# Patient Record
Sex: Female | Born: 1935 | Race: White | Hispanic: No | Smoking: Never smoker
Health system: Southern US, Community
[De-identification: ages and names within clinical notes are randomized; demographics above are authoritative.]

## PROBLEM LIST (undated history)

## (undated) DIAGNOSIS — T4145XA Adverse effect of unspecified anesthetic, initial encounter: Secondary | ICD-10-CM

## (undated) DIAGNOSIS — I4430 Unspecified atrioventricular block: Secondary | ICD-10-CM

## (undated) DIAGNOSIS — T8859XA Other complications of anesthesia, initial encounter: Secondary | ICD-10-CM

## (undated) DIAGNOSIS — E119 Type 2 diabetes mellitus without complications: Secondary | ICD-10-CM

## (undated) DIAGNOSIS — R109 Unspecified abdominal pain: Secondary | ICD-10-CM

## (undated) DIAGNOSIS — I639 Cerebral infarction, unspecified: Secondary | ICD-10-CM

## (undated) DIAGNOSIS — H269 Unspecified cataract: Secondary | ICD-10-CM

## (undated) HISTORY — PX: JOINT REPLACEMENT: SHX530

## (undated) HISTORY — DX: Type 2 diabetes mellitus without complications: E11.9

## (undated) HISTORY — PX: CATARACT EXTRACTION, BILATERAL: SHX1313

## (undated) HISTORY — PX: ABDOMINAL HYSTERECTOMY: SHX81

## (undated) HISTORY — DX: Cerebral infarction, unspecified: I63.9

## (undated) HISTORY — DX: Unspecified cataract: H26.9

---

## 2009-10-23 ENCOUNTER — Inpatient Hospital Stay (HOSPITAL_COMMUNITY): Admission: RE | Admit: 2009-10-23 | Discharge: 2009-10-29 | Payer: Self-pay | Admitting: Orthopaedic Surgery

## 2009-10-25 ENCOUNTER — Encounter (INDEPENDENT_AMBULATORY_CARE_PROVIDER_SITE_OTHER): Payer: Self-pay | Admitting: Neurology

## 2009-10-26 ENCOUNTER — Ambulatory Visit: Payer: Self-pay | Admitting: Vascular Surgery

## 2009-10-26 ENCOUNTER — Encounter (INDEPENDENT_AMBULATORY_CARE_PROVIDER_SITE_OTHER): Payer: Self-pay | Admitting: Neurology

## 2010-08-19 LAB — PROTIME-INR
INR: 1.73 — ABNORMAL HIGH (ref 0.00–1.49)
INR: 1.79 — ABNORMAL HIGH (ref 0.00–1.49)
INR: 1.94 — ABNORMAL HIGH (ref 0.00–1.49)
Prothrombin Time: 20.6 seconds — ABNORMAL HIGH (ref 11.6–15.2)

## 2010-08-19 LAB — BASIC METABOLIC PANEL
BUN: 12 mg/dL (ref 6–23)
BUN: 19 mg/dL (ref 6–23)
CO2: 29 mEq/L (ref 19–32)
CO2: 30 mEq/L (ref 19–32)
CO2: 30 mEq/L (ref 19–32)
Calcium: 8.6 mg/dL (ref 8.4–10.5)
Calcium: 8.8 mg/dL (ref 8.4–10.5)
Calcium: 8.9 mg/dL (ref 8.4–10.5)
Calcium: 9.4 mg/dL (ref 8.4–10.5)
Chloride: 97 mEq/L (ref 96–112)
Creatinine, Ser: 0.55 mg/dL (ref 0.4–1.2)
Creatinine, Ser: 0.61 mg/dL (ref 0.4–1.2)
Creatinine, Ser: 0.63 mg/dL (ref 0.4–1.2)
Creatinine, Ser: 0.89 mg/dL (ref 0.4–1.2)
GFR calc Af Amer: >60 mL/min (ref >60)
GFR calc Af Amer: >60 mL/min (ref >60)
GFR calc Af Amer: >60 mL/min (ref >60)
GFR calc non Af Amer: >60 mL/min (ref >60)
GFR calc non Af Amer: >60 mL/min (ref >60)
Glucose, Bld: 102 mg/dL — ABNORMAL HIGH (ref 70–99)
Glucose, Bld: 163 mg/dL — ABNORMAL HIGH (ref 70–99)
Glucose, Bld: 82 mg/dL (ref 70–99)
Potassium: 3.8 mEq/L (ref 3.5–5.1)
Sodium: 139 mEq/L (ref 135–145)
Sodium: 140 mEq/L (ref 135–145)

## 2010-08-19 LAB — CBC
HCT: 30.5 % — ABNORMAL LOW (ref 36.0–46.0)
HCT: 42.3 % (ref 36.0–46.0)
Hemoglobin: 10.6 g/dL — ABNORMAL LOW (ref 12.0–15.0)
Hemoglobin: 12.2 g/dL (ref 12.0–15.0)
Hemoglobin: 14.5 g/dL (ref 12.0–15.0)
MCHC: 34.4 g/dL (ref 30.0–36.0)
MCHC: 34.7 g/dL (ref 30.0–36.0)
MCHC: 35.3 g/dL (ref 30.0–36.0)
Platelets: 179 10*3/uL (ref 150–400)
Platelets: 187 10*3/uL (ref 150–400)
RBC: 3.61 MIL/uL — ABNORMAL LOW (ref 3.87–5.11)
RDW: 13.4 % (ref 11.5–15.5)
RDW: 13.5 % (ref 11.5–15.5)
WBC: 10.2 10*3/uL (ref 4.0–10.5)
WBC: 9 10*3/uL (ref 4.0–10.5)

## 2010-08-19 LAB — GLUCOSE, CAPILLARY
Glucose-Capillary: 121 mg/dL — ABNORMAL HIGH (ref 70–99)
Glucose-Capillary: 123 mg/dL — ABNORMAL HIGH (ref 70–99)
Glucose-Capillary: 139 mg/dL — ABNORMAL HIGH (ref 70–99)
Glucose-Capillary: 145 mg/dL — ABNORMAL HIGH (ref 70–99)
Glucose-Capillary: 152 mg/dL — ABNORMAL HIGH (ref 70–99)
Glucose-Capillary: 154 mg/dL — ABNORMAL HIGH (ref 70–99)
Glucose-Capillary: 156 mg/dL — ABNORMAL HIGH (ref 70–99)
Glucose-Capillary: 157 mg/dL — ABNORMAL HIGH (ref 70–99)
Glucose-Capillary: 165 mg/dL — ABNORMAL HIGH (ref 70–99)
Glucose-Capillary: 176 mg/dL — ABNORMAL HIGH (ref 70–99)
Glucose-Capillary: 184 mg/dL — ABNORMAL HIGH (ref 70–99)
Glucose-Capillary: 200 mg/dL — ABNORMAL HIGH (ref 70–99)
Glucose-Capillary: 95 mg/dL (ref 70–99)

## 2010-08-19 LAB — COMPREHENSIVE METABOLIC PANEL
Alkaline Phosphatase: 40 U/L (ref 39–117)
BUN: 11 mg/dL (ref 6–23)
Chloride: 100 mEq/L (ref 96–112)
GFR calc non Af Amer: >60 mL/min (ref >60)
Glucose, Bld: 122 mg/dL — ABNORMAL HIGH (ref 70–99)
Potassium: 3.3 mEq/L — ABNORMAL LOW (ref 3.5–5.1)
Total Bilirubin: 0.7 mg/dL (ref 0.3–1.2)

## 2010-08-19 LAB — TSH: TSH: 0.86 u[IU]/mL (ref 0.350–4.500)

## 2010-08-19 LAB — HEMOGLOBIN A1C
Hgb A1c MFr Bld: 6.1 % — ABNORMAL HIGH (ref <5.7)
Hgb A1c MFr Bld: 6.1 % — ABNORMAL HIGH (ref <5.7)
Mean Plasma Glucose: 128 mg/dL — ABNORMAL HIGH (ref <117)

## 2011-03-10 ENCOUNTER — Encounter (INDEPENDENT_AMBULATORY_CARE_PROVIDER_SITE_OTHER): Payer: Medicare Other | Admitting: Ophthalmology

## 2011-03-10 DIAGNOSIS — E11319 Type 2 diabetes mellitus with unspecified diabetic retinopathy without macular edema: Secondary | ICD-10-CM

## 2011-03-10 DIAGNOSIS — H43819 Vitreous degeneration, unspecified eye: Secondary | ICD-10-CM

## 2011-03-10 DIAGNOSIS — H3530 Unspecified macular degeneration: Secondary | ICD-10-CM

## 2011-03-10 DIAGNOSIS — H35059 Retinal neovascularization, unspecified, unspecified eye: Secondary | ICD-10-CM

## 2011-03-24 ENCOUNTER — Encounter (INDEPENDENT_AMBULATORY_CARE_PROVIDER_SITE_OTHER): Payer: No Typology Code available for payment source | Admitting: Ophthalmology

## 2011-04-01 ENCOUNTER — Encounter (INDEPENDENT_AMBULATORY_CARE_PROVIDER_SITE_OTHER): Payer: Medicare Other | Admitting: Ophthalmology

## 2011-04-01 DIAGNOSIS — H35059 Retinal neovascularization, unspecified, unspecified eye: Secondary | ICD-10-CM

## 2011-04-01 DIAGNOSIS — H43819 Vitreous degeneration, unspecified eye: Secondary | ICD-10-CM

## 2011-04-01 DIAGNOSIS — H3530 Unspecified macular degeneration: Secondary | ICD-10-CM

## 2011-04-29 ENCOUNTER — Encounter (INDEPENDENT_AMBULATORY_CARE_PROVIDER_SITE_OTHER): Payer: Medicare Other | Admitting: Ophthalmology

## 2011-04-29 DIAGNOSIS — H3530 Unspecified macular degeneration: Secondary | ICD-10-CM

## 2011-08-01 ENCOUNTER — Encounter (INDEPENDENT_AMBULATORY_CARE_PROVIDER_SITE_OTHER): Payer: Medicare Other | Admitting: Ophthalmology

## 2011-08-01 DIAGNOSIS — H35039 Hypertensive retinopathy, unspecified eye: Secondary | ICD-10-CM

## 2011-08-01 DIAGNOSIS — I10 Essential (primary) hypertension: Secondary | ICD-10-CM

## 2011-08-01 DIAGNOSIS — E11319 Type 2 diabetes mellitus with unspecified diabetic retinopathy without macular edema: Secondary | ICD-10-CM

## 2011-08-01 DIAGNOSIS — H43819 Vitreous degeneration, unspecified eye: Secondary | ICD-10-CM

## 2011-08-01 DIAGNOSIS — H3530 Unspecified macular degeneration: Secondary | ICD-10-CM

## 2011-08-01 DIAGNOSIS — E1165 Type 2 diabetes mellitus with hyperglycemia: Secondary | ICD-10-CM

## 2012-02-03 ENCOUNTER — Ambulatory Visit (INDEPENDENT_AMBULATORY_CARE_PROVIDER_SITE_OTHER): Payer: Medicare Other | Admitting: Ophthalmology

## 2012-02-03 DIAGNOSIS — I10 Essential (primary) hypertension: Secondary | ICD-10-CM

## 2012-02-03 DIAGNOSIS — E1139 Type 2 diabetes mellitus with other diabetic ophthalmic complication: Secondary | ICD-10-CM

## 2012-02-03 DIAGNOSIS — E11319 Type 2 diabetes mellitus with unspecified diabetic retinopathy without macular edema: Secondary | ICD-10-CM

## 2012-02-03 DIAGNOSIS — H3530 Unspecified macular degeneration: Secondary | ICD-10-CM

## 2012-02-03 DIAGNOSIS — H3581 Retinal edema: Secondary | ICD-10-CM

## 2012-02-03 DIAGNOSIS — H43819 Vitreous degeneration, unspecified eye: Secondary | ICD-10-CM

## 2012-02-18 ENCOUNTER — Ambulatory Visit (INDEPENDENT_AMBULATORY_CARE_PROVIDER_SITE_OTHER): Payer: Medicare Other | Admitting: Ophthalmology

## 2012-02-18 DIAGNOSIS — E1139 Type 2 diabetes mellitus with other diabetic ophthalmic complication: Secondary | ICD-10-CM

## 2012-02-18 DIAGNOSIS — H3530 Unspecified macular degeneration: Secondary | ICD-10-CM

## 2012-02-18 DIAGNOSIS — E11319 Type 2 diabetes mellitus with unspecified diabetic retinopathy without macular edema: Secondary | ICD-10-CM

## 2012-02-18 DIAGNOSIS — H35039 Hypertensive retinopathy, unspecified eye: Secondary | ICD-10-CM

## 2012-02-18 DIAGNOSIS — H43819 Vitreous degeneration, unspecified eye: Secondary | ICD-10-CM

## 2012-02-18 DIAGNOSIS — H353290 Exudative age-related macular degeneration, unspecified eye, stage unspecified: Secondary | ICD-10-CM

## 2012-02-18 DIAGNOSIS — E1165 Type 2 diabetes mellitus with hyperglycemia: Secondary | ICD-10-CM

## 2012-02-18 DIAGNOSIS — I10 Essential (primary) hypertension: Secondary | ICD-10-CM

## 2012-02-24 ENCOUNTER — Ambulatory Visit (INDEPENDENT_AMBULATORY_CARE_PROVIDER_SITE_OTHER): Payer: Medicare Other | Admitting: Ophthalmology

## 2012-02-24 DIAGNOSIS — H353290 Exudative age-related macular degeneration, unspecified eye, stage unspecified: Secondary | ICD-10-CM

## 2012-02-24 DIAGNOSIS — I10 Essential (primary) hypertension: Secondary | ICD-10-CM

## 2012-02-24 DIAGNOSIS — H3530 Unspecified macular degeneration: Secondary | ICD-10-CM

## 2012-02-24 DIAGNOSIS — H43819 Vitreous degeneration, unspecified eye: Secondary | ICD-10-CM

## 2012-02-24 DIAGNOSIS — E11319 Type 2 diabetes mellitus with unspecified diabetic retinopathy without macular edema: Secondary | ICD-10-CM

## 2012-02-24 DIAGNOSIS — E1165 Type 2 diabetes mellitus with hyperglycemia: Secondary | ICD-10-CM

## 2012-02-24 DIAGNOSIS — H35039 Hypertensive retinopathy, unspecified eye: Secondary | ICD-10-CM

## 2012-03-23 ENCOUNTER — Encounter (INDEPENDENT_AMBULATORY_CARE_PROVIDER_SITE_OTHER): Payer: Medicare Other | Admitting: Ophthalmology

## 2012-03-23 DIAGNOSIS — E11319 Type 2 diabetes mellitus with unspecified diabetic retinopathy without macular edema: Secondary | ICD-10-CM

## 2012-03-23 DIAGNOSIS — H3530 Unspecified macular degeneration: Secondary | ICD-10-CM

## 2012-03-23 DIAGNOSIS — H35039 Hypertensive retinopathy, unspecified eye: Secondary | ICD-10-CM

## 2012-03-23 DIAGNOSIS — H43819 Vitreous degeneration, unspecified eye: Secondary | ICD-10-CM

## 2012-03-23 DIAGNOSIS — I10 Essential (primary) hypertension: Secondary | ICD-10-CM

## 2012-03-23 DIAGNOSIS — H353290 Exudative age-related macular degeneration, unspecified eye, stage unspecified: Secondary | ICD-10-CM

## 2012-03-23 DIAGNOSIS — E1139 Type 2 diabetes mellitus with other diabetic ophthalmic complication: Secondary | ICD-10-CM

## 2012-05-04 ENCOUNTER — Encounter (INDEPENDENT_AMBULATORY_CARE_PROVIDER_SITE_OTHER): Payer: Medicare Other | Admitting: Ophthalmology

## 2012-05-04 DIAGNOSIS — I10 Essential (primary) hypertension: Secondary | ICD-10-CM

## 2012-05-04 DIAGNOSIS — E11319 Type 2 diabetes mellitus with unspecified diabetic retinopathy without macular edema: Secondary | ICD-10-CM

## 2012-05-04 DIAGNOSIS — H43819 Vitreous degeneration, unspecified eye: Secondary | ICD-10-CM

## 2012-05-04 DIAGNOSIS — H353290 Exudative age-related macular degeneration, unspecified eye, stage unspecified: Secondary | ICD-10-CM

## 2012-05-04 DIAGNOSIS — E1139 Type 2 diabetes mellitus with other diabetic ophthalmic complication: Secondary | ICD-10-CM

## 2012-05-04 DIAGNOSIS — H3530 Unspecified macular degeneration: Secondary | ICD-10-CM

## 2012-05-04 DIAGNOSIS — H35039 Hypertensive retinopathy, unspecified eye: Secondary | ICD-10-CM

## 2012-06-29 ENCOUNTER — Encounter (INDEPENDENT_AMBULATORY_CARE_PROVIDER_SITE_OTHER): Payer: Medicare Other | Admitting: Ophthalmology

## 2012-06-29 DIAGNOSIS — H35039 Hypertensive retinopathy, unspecified eye: Secondary | ICD-10-CM

## 2012-06-29 DIAGNOSIS — H353290 Exudative age-related macular degeneration, unspecified eye, stage unspecified: Secondary | ICD-10-CM

## 2012-06-29 DIAGNOSIS — H43819 Vitreous degeneration, unspecified eye: Secondary | ICD-10-CM

## 2012-06-29 DIAGNOSIS — I10 Essential (primary) hypertension: Secondary | ICD-10-CM

## 2012-07-27 ENCOUNTER — Encounter (INDEPENDENT_AMBULATORY_CARE_PROVIDER_SITE_OTHER): Payer: Medicare Other | Admitting: Ophthalmology

## 2012-07-27 DIAGNOSIS — H35039 Hypertensive retinopathy, unspecified eye: Secondary | ICD-10-CM

## 2012-07-27 DIAGNOSIS — H353290 Exudative age-related macular degeneration, unspecified eye, stage unspecified: Secondary | ICD-10-CM

## 2012-07-27 DIAGNOSIS — H43819 Vitreous degeneration, unspecified eye: Secondary | ICD-10-CM

## 2012-07-27 DIAGNOSIS — I10 Essential (primary) hypertension: Secondary | ICD-10-CM

## 2012-09-07 ENCOUNTER — Encounter (INDEPENDENT_AMBULATORY_CARE_PROVIDER_SITE_OTHER): Payer: Medicare Other | Admitting: Ophthalmology

## 2012-09-07 DIAGNOSIS — H353290 Exudative age-related macular degeneration, unspecified eye, stage unspecified: Secondary | ICD-10-CM

## 2012-09-07 DIAGNOSIS — H43819 Vitreous degeneration, unspecified eye: Secondary | ICD-10-CM

## 2012-09-07 DIAGNOSIS — H35039 Hypertensive retinopathy, unspecified eye: Secondary | ICD-10-CM

## 2012-09-07 DIAGNOSIS — I10 Essential (primary) hypertension: Secondary | ICD-10-CM

## 2012-10-07 ENCOUNTER — Encounter (INDEPENDENT_AMBULATORY_CARE_PROVIDER_SITE_OTHER): Payer: Medicare Other | Admitting: Ophthalmology

## 2012-10-07 DIAGNOSIS — H43819 Vitreous degeneration, unspecified eye: Secondary | ICD-10-CM

## 2012-10-07 DIAGNOSIS — I10 Essential (primary) hypertension: Secondary | ICD-10-CM

## 2012-10-07 DIAGNOSIS — H353290 Exudative age-related macular degeneration, unspecified eye, stage unspecified: Secondary | ICD-10-CM

## 2012-10-07 DIAGNOSIS — H35039 Hypertensive retinopathy, unspecified eye: Secondary | ICD-10-CM

## 2012-11-18 ENCOUNTER — Encounter (INDEPENDENT_AMBULATORY_CARE_PROVIDER_SITE_OTHER): Payer: Medicare Other | Admitting: Ophthalmology

## 2012-11-18 DIAGNOSIS — H3530 Unspecified macular degeneration: Secondary | ICD-10-CM

## 2012-11-18 DIAGNOSIS — H353290 Exudative age-related macular degeneration, unspecified eye, stage unspecified: Secondary | ICD-10-CM

## 2012-11-18 DIAGNOSIS — H35039 Hypertensive retinopathy, unspecified eye: Secondary | ICD-10-CM

## 2012-11-18 DIAGNOSIS — H43819 Vitreous degeneration, unspecified eye: Secondary | ICD-10-CM

## 2012-11-18 DIAGNOSIS — I10 Essential (primary) hypertension: Secondary | ICD-10-CM

## 2012-12-23 ENCOUNTER — Encounter (INDEPENDENT_AMBULATORY_CARE_PROVIDER_SITE_OTHER): Payer: Medicare Other | Admitting: Ophthalmology

## 2012-12-23 DIAGNOSIS — H3530 Unspecified macular degeneration: Secondary | ICD-10-CM

## 2012-12-23 DIAGNOSIS — H43819 Vitreous degeneration, unspecified eye: Secondary | ICD-10-CM

## 2012-12-23 DIAGNOSIS — H353290 Exudative age-related macular degeneration, unspecified eye, stage unspecified: Secondary | ICD-10-CM

## 2012-12-23 DIAGNOSIS — H35039 Hypertensive retinopathy, unspecified eye: Secondary | ICD-10-CM

## 2012-12-23 DIAGNOSIS — I10 Essential (primary) hypertension: Secondary | ICD-10-CM

## 2013-02-03 ENCOUNTER — Encounter (INDEPENDENT_AMBULATORY_CARE_PROVIDER_SITE_OTHER): Payer: Medicare Other | Admitting: Ophthalmology

## 2013-02-03 DIAGNOSIS — H43819 Vitreous degeneration, unspecified eye: Secondary | ICD-10-CM

## 2013-02-03 DIAGNOSIS — H353290 Exudative age-related macular degeneration, unspecified eye, stage unspecified: Secondary | ICD-10-CM

## 2013-02-03 DIAGNOSIS — H35039 Hypertensive retinopathy, unspecified eye: Secondary | ICD-10-CM

## 2013-02-03 DIAGNOSIS — I10 Essential (primary) hypertension: Secondary | ICD-10-CM

## 2013-03-10 ENCOUNTER — Encounter (INDEPENDENT_AMBULATORY_CARE_PROVIDER_SITE_OTHER): Payer: Medicare Other | Admitting: Ophthalmology

## 2013-03-10 DIAGNOSIS — I10 Essential (primary) hypertension: Secondary | ICD-10-CM

## 2013-03-10 DIAGNOSIS — H43819 Vitreous degeneration, unspecified eye: Secondary | ICD-10-CM

## 2013-03-10 DIAGNOSIS — H35039 Hypertensive retinopathy, unspecified eye: Secondary | ICD-10-CM

## 2013-03-10 DIAGNOSIS — H353290 Exudative age-related macular degeneration, unspecified eye, stage unspecified: Secondary | ICD-10-CM

## 2013-03-29 ENCOUNTER — Ambulatory Visit (INDEPENDENT_AMBULATORY_CARE_PROVIDER_SITE_OTHER): Payer: Medicare Other | Admitting: Family Medicine

## 2013-03-29 ENCOUNTER — Ambulatory Visit: Payer: Medicare Other

## 2013-03-29 VITALS — BP 130/62 | HR 85 | Temp 98.1°F | Resp 18 | Wt 172.0 lb

## 2013-03-29 DIAGNOSIS — S8990XA Unspecified injury of unspecified lower leg, initial encounter: Secondary | ICD-10-CM

## 2013-03-29 DIAGNOSIS — S8992XA Unspecified injury of left lower leg, initial encounter: Secondary | ICD-10-CM

## 2013-03-29 DIAGNOSIS — M25569 Pain in unspecified knee: Secondary | ICD-10-CM

## 2013-03-29 DIAGNOSIS — M7989 Other specified soft tissue disorders: Secondary | ICD-10-CM

## 2013-03-29 DIAGNOSIS — M25562 Pain in left knee: Secondary | ICD-10-CM

## 2013-03-29 MED ORDER — TRAMADOL HCL 50 MG PO TABS: 50.0000 mg | ORAL_TABLET | Freq: Four times a day (QID) | ORAL | Status: DC | PRN

## 2013-03-29 NOTE — Progress Notes (Signed)
Subjective:    Patient ID: Briana Martinez, female    DOB: 20-Oct-1921, 79 y.o.   MRN: [id number] This chart was scribed for Meredith Staggers, MD by Danella Maiers, ED Scribe. This patient was seen in room 11 and the patient's care was started at 3:18 PM.  Chief Complaint  Patient presents with  . Knee Injury    left- fell a week ago having some swelling in ankle and leg with fever    HPI HPI Comments: Briana Martinez is a 79 y.o. female with a h/o left knee replacement who presents to the [facility] complaining of falling off a stool 8 days ago with resulting worsening left leg pain. Here with her daughter. Daughter states she took pt to her orthopaedic surgeon, Dr Jerl Santos, who x-rayed her knee and stated her knee was fine but she might be sore for a few days. Two days ago she reports significant swelling and warmth to touch. Bruising started today. She states the swelling has gone down some today. She reports associated hip pain since a few days ago that hurts when she moves. Having a difficulty history on the hip pain, pt is unsure whether she is sore or not.later states no pain in hip.  Pt reports pain from the knee down when she puts weight on the left leg, then later in visit - denied pain in hip, more pain in outside of L leg.  She has been icing the area and taking hydrocodone and tylenol. She denies CP, SOB. She denies h/o blood clots. She is on aspirin 325 mg per day.    Review of Systems  Respiratory: Negative for shortness of breath.   Cardiovascular: Negative for chest pain.  Musculoskeletal: Positive for arthralgias.    There are no active problems to display for this patient.  Past Medical History  Diagnosis Date  . Cataract   . Diabetes mellitus without complication    Past Surgical History  Procedure Laterality Date  . Joint replacement    . Abdominal hysterectomy     No Known Allergies Prior to Admission medications   Medication Sig Start Date End  Date Taking? Authorizing Provider  amLODipine (NORVASC) 5 MG tablet Take 5 mg by mouth daily.   Yes Historical Provider, MD  aspirin 81 MG tablet Take 81 mg by mouth daily.   Yes Historical Provider, MD  glipiZIDE (GLUCOTROL) 5 MG tablet Take 5 mg by mouth 3 (three) times daily.   Yes Historical Provider, MD  Multiple Vitamin (MULTI-VITAMIN PO) Take by mouth.   Yes Historical Provider, MD  Multiple Vitamins-Minerals (PRESERVISION AREDS PO) Take by mouth 2 (two) times daily.   Yes Historical Provider, MD  pravastatin (PRAVACHOL) 20 MG tablet Take 20 mg by mouth daily.   Yes Historical Provider, MD        Objective:   Physical Exam  Nursing note and vitals reviewed. Constitutional: She is oriented to person, place, and time. She appears well-developed and well-nourished. No distress.  HENT:  Head: Normocephalic and atraumatic.  Cardiovascular: Normal rate, regular rhythm, normal heart sounds and intact distal pulses.   Pulmonary/Chest: Effort normal and breath sounds normal. No respiratory distress. She has no wheezes. She has no rales.  Musculoskeletal: Normal range of motion.       Left lower leg: She exhibits tenderness and swelling.  Bruising to lateral and dorsum of left foot, ecchymosis. No focal bony tenderness of foot or ankle. neurovascularly intact distally to toes. Slight  hyper pigmentations, faded ecchymosis from posterior knee throughout calf to foot. Full extension of the left knee, flexion to about 140-150 degrees. ttp in popliteal fossa to upper calf, slight ttp of the distal hamstring. With int and ext rotations of the hip, little bit of slight discomfort but no pain. Negative homans sign.  Neurological: She is alert and oriented to person, place, and time.  Skin: Skin is warm and dry.  Psychiatric: She has a normal mood and affect. Her behavior is normal.  warmth and edema of LE from knee to ankle, without rash or notable erythema, diffuse ttp around knee - popliteal fossa to  lateral knee.   Filed Vitals:   03/29/13 1419  BP: 130/62  Pulse: 85  Temp: 98.1 F (36.7 C)  TempSrc: Oral  Resp: 18  Weight: 172 lb (78.019 kg)  SpO2: 97%    [facility] reading (PRIMARY) by  Dr. Neva Seat: L hip: degenerative dz without apparent fx.  L knee: s/p knee replacement - appears located, some surrounding degenerative change  * xray report reviewed - stat report: IMPRESSION:  Findings suspicious for a left distal femur lateral condylar  fracture. This is a subtle finding. Consider CT for confirmation.  Prior left knee arthroplasty  Osteopenia  Small effusion  L Tib fib: no apparent fx.   1750: call placed to [facility] re: above, D.r Dumonski.       Assessment & Plan:  Briana Martinez is a 79 y.o. female Briana Martinez is a 79 y.o. female Leg injury, left, initial encounter - Plan: DG Knee Complete 4 Views Left, DG Tibia/Fibula Left, DG Ankle Complete Left, DG Hip Complete Left, CT Knee Left Wo Contrast, traMADol (ULTRAM) 50 MG tablet  Knee pain, acute, left - Plan: CT Knee Left Wo Contrast, traMADol (ULTRAM) 50 MG tablet  Left leg swelling - Plan: CT Knee Left Wo Contrast, traMADol (ULTRAM) 50 MG tablet  L knee to lower leg progressive swelling and ecchymosis after fall 8 days ago.  Possible lateral condylar fx. Discussed with orthopaedist on call for Dr. Jerl Santos.  Placed in knee immobilizer with min WB as possible with walker/cane and follow up with ortho in next 2 days, but will try to obtain CT of knee prior for further eval of subtle fracture area. Ultram if needed for pain - SED, and RTC precautions given to pt and dtr. They will call orth tomorrow am to be seen.    Meds ordered this encounter  Medications  . aspirin 81 MG tablet    Sig: Take 81 mg by mouth daily.  . pravastatin (PRAVACHOL) 20 MG tablet    Sig: Take 20 mg by mouth daily.  Marland Kitchen glipiZIDE (GLUCOTROL) 5 MG tablet    Sig: Take 5 mg by mouth 3 (three) times daily.  Marland Kitchen amLODipine (NORVASC) 5 MG  tablet    Sig: Take 5 mg by mouth daily.  . Multiple Vitamin (MULTI-VITAMIN PO)    Sig: Take by mouth.  . Multiple Vitamins-Minerals (PRESERVISION AREDS PO)    Sig: Take by mouth 2 (two) times daily.  . traMADol (ULTRAM) 50 MG tablet    Sig: Take 1 tablet (50 mg total) by mouth every 6 (six) hours as needed for pain.    Dispense:  15 tablet    Refill:  0   Patient Instructions  Wear the knee immobilizer until seen by your orthopaedic doctor. Try to stay off the leg until seen, but slight weight bear with cane if needed.  We will schedule a ct scan of your knee for better evaluation of a possible fracture, but call Dr. Nolon Nations office tomorrow to be seen in the next 2 days. Return to the clinic or go to the nearest emergency room if any of your symptoms worsen or new symptoms occur.

## 2013-03-29 NOTE — Patient Instructions (Signed)
Wear the knee immobilizer until seen by your orthopaedic doctor. Try to stay off the leg until seen, but slight weight bear with cane if needed. We will schedule a ct scan of your knee for better evaluation of a possible fracture, but call Dr. Nolon Nations office tomorrow to be seen in the next 2 days. Return to the clinic or go to the nearest emergency room if any of your symptoms worsen or new symptoms occur.

## 2013-03-30 ENCOUNTER — Telehealth: Payer: Self-pay

## 2013-03-30 ENCOUNTER — Telehealth: Payer: Self-pay | Admitting: Family Medicine

## 2013-03-30 ENCOUNTER — Ambulatory Visit
Admission: RE | Admit: 2013-03-30 | Discharge: 2013-03-30 | Disposition: A | Discharge status: Home / Self Care | Payer: Medicare Other | Source: Ambulatory Visit | Attending: Family Medicine | Admitting: Family Medicine

## 2013-03-30 DIAGNOSIS — S8992XA Unspecified injury of left lower leg, initial encounter: Secondary | ICD-10-CM

## 2013-03-30 DIAGNOSIS — M25562 Pain in left knee: Secondary | ICD-10-CM

## 2013-03-30 DIAGNOSIS — S8010XS Contusion of unspecified lower leg, sequela: Secondary | ICD-10-CM

## 2013-03-30 DIAGNOSIS — M7989 Other specified soft tissue disorders: Secondary | ICD-10-CM

## 2013-03-30 NOTE — Telephone Encounter (Signed)
Thank you. I have put in referral. Called Dr Jerl Santos office. Her daughter Fannie Knee called there and Dr Jerl Santos is aware of the situation with the patient, and wants to review the CT Scan then advise. I will call Dr Jerl Santos this afternoon and make sure they get the information.

## 2013-03-30 NOTE — Telephone Encounter (Signed)
Call report: Findings suspicious for a left distal femur lateral condylar fracture. This is a subtle finding. Consider CT for confirmation

## 2013-03-30 NOTE — Telephone Encounter (Signed)
Patient's daughter is calling about an appointment to an orthopaedic surgeon for her mother.  [phone]

## 2013-03-30 NOTE — Telephone Encounter (Signed)
Noted.  See ov notes from last night - she is supposed to be seen by ortho and does not need to wait on results of CT to schedule. the patient was supposed to call Dr. Nolon Nations office today to get appointment as I spoke to the on call doctor last night that indicated they would be able to work her in in the next few days if needed. Hopefully the CT can be done today, so they will have results to review at ortho appt.

## 2013-03-30 NOTE — Telephone Encounter (Signed)
Has she had the CT yet? She should go today for a walkin scan at [facility], [address] [place], daughter Fannie Knee advised. She will take her. Lupita Leash will advise [facility] imaging patient will need help getting out of the car. Once we have the results of the CT, will work on ortho (if indicated) to you [employer]

## 2013-03-30 NOTE — Telephone Encounter (Signed)
This was noted last night - see ov.

## 2013-04-21 ENCOUNTER — Encounter (INDEPENDENT_AMBULATORY_CARE_PROVIDER_SITE_OTHER): Payer: Medicare Other | Admitting: Ophthalmology

## 2013-04-21 DIAGNOSIS — I10 Essential (primary) hypertension: Secondary | ICD-10-CM

## 2013-04-21 DIAGNOSIS — H43819 Vitreous degeneration, unspecified eye: Secondary | ICD-10-CM

## 2013-04-21 DIAGNOSIS — H35039 Hypertensive retinopathy, unspecified eye: Secondary | ICD-10-CM

## 2013-04-21 DIAGNOSIS — H353290 Exudative age-related macular degeneration, unspecified eye, stage unspecified: Secondary | ICD-10-CM

## 2013-06-30 ENCOUNTER — Encounter (INDEPENDENT_AMBULATORY_CARE_PROVIDER_SITE_OTHER): Payer: Medicare Other | Admitting: Ophthalmology

## 2013-06-30 DIAGNOSIS — H35039 Hypertensive retinopathy, unspecified eye: Secondary | ICD-10-CM

## 2013-06-30 DIAGNOSIS — H43819 Vitreous degeneration, unspecified eye: Secondary | ICD-10-CM

## 2013-06-30 DIAGNOSIS — I10 Essential (primary) hypertension: Secondary | ICD-10-CM

## 2013-06-30 DIAGNOSIS — H353290 Exudative age-related macular degeneration, unspecified eye, stage unspecified: Secondary | ICD-10-CM

## 2013-08-11 ENCOUNTER — Encounter (INDEPENDENT_AMBULATORY_CARE_PROVIDER_SITE_OTHER): Payer: Medicare Other | Admitting: Ophthalmology

## 2013-08-11 DIAGNOSIS — H43819 Vitreous degeneration, unspecified eye: Secondary | ICD-10-CM

## 2013-08-11 DIAGNOSIS — I10 Essential (primary) hypertension: Secondary | ICD-10-CM

## 2013-08-11 DIAGNOSIS — H35039 Hypertensive retinopathy, unspecified eye: Secondary | ICD-10-CM

## 2013-08-11 DIAGNOSIS — H353290 Exudative age-related macular degeneration, unspecified eye, stage unspecified: Secondary | ICD-10-CM

## 2013-10-06 ENCOUNTER — Encounter (INDEPENDENT_AMBULATORY_CARE_PROVIDER_SITE_OTHER): Payer: Medicare Other | Admitting: Ophthalmology

## 2013-10-06 DIAGNOSIS — H353290 Exudative age-related macular degeneration, unspecified eye, stage unspecified: Secondary | ICD-10-CM

## 2013-10-06 DIAGNOSIS — H43819 Vitreous degeneration, unspecified eye: Secondary | ICD-10-CM

## 2013-10-06 DIAGNOSIS — I10 Essential (primary) hypertension: Secondary | ICD-10-CM

## 2013-10-06 DIAGNOSIS — H35039 Hypertensive retinopathy, unspecified eye: Secondary | ICD-10-CM

## 2013-12-15 ENCOUNTER — Encounter (INDEPENDENT_AMBULATORY_CARE_PROVIDER_SITE_OTHER): Payer: Medicare Other | Admitting: Ophthalmology

## 2013-12-15 DIAGNOSIS — H35039 Hypertensive retinopathy, unspecified eye: Secondary | ICD-10-CM

## 2013-12-15 DIAGNOSIS — H353290 Exudative age-related macular degeneration, unspecified eye, stage unspecified: Secondary | ICD-10-CM

## 2013-12-15 DIAGNOSIS — H43819 Vitreous degeneration, unspecified eye: Secondary | ICD-10-CM

## 2013-12-15 DIAGNOSIS — I10 Essential (primary) hypertension: Secondary | ICD-10-CM

## 2014-03-09 ENCOUNTER — Encounter (INDEPENDENT_AMBULATORY_CARE_PROVIDER_SITE_OTHER): Payer: Medicare Other | Admitting: Ophthalmology

## 2014-03-09 DIAGNOSIS — H3531 Nonexudative age-related macular degeneration: Secondary | ICD-10-CM

## 2014-03-09 DIAGNOSIS — H43813 Vitreous degeneration, bilateral: Secondary | ICD-10-CM

## 2014-03-09 DIAGNOSIS — H3532 Exudative age-related macular degeneration: Secondary | ICD-10-CM

## 2014-03-09 DIAGNOSIS — I10 Essential (primary) hypertension: Secondary | ICD-10-CM

## 2014-03-09 DIAGNOSIS — H35033 Hypertensive retinopathy, bilateral: Secondary | ICD-10-CM

## 2014-06-15 ENCOUNTER — Encounter (INDEPENDENT_AMBULATORY_CARE_PROVIDER_SITE_OTHER): Payer: Medicare Other | Admitting: Ophthalmology

## 2014-06-15 DIAGNOSIS — H3531 Nonexudative age-related macular degeneration: Secondary | ICD-10-CM

## 2014-06-15 DIAGNOSIS — H3532 Exudative age-related macular degeneration: Secondary | ICD-10-CM

## 2014-06-15 DIAGNOSIS — H43813 Vitreous degeneration, bilateral: Secondary | ICD-10-CM

## 2014-06-15 DIAGNOSIS — I10 Essential (primary) hypertension: Secondary | ICD-10-CM

## 2014-06-15 DIAGNOSIS — H35033 Hypertensive retinopathy, bilateral: Secondary | ICD-10-CM

## 2014-09-21 ENCOUNTER — Encounter (INDEPENDENT_AMBULATORY_CARE_PROVIDER_SITE_OTHER): Payer: Medicare Other | Admitting: Ophthalmology

## 2014-09-21 DIAGNOSIS — H3532 Exudative age-related macular degeneration: Secondary | ICD-10-CM | POA: Diagnosis not present

## 2014-09-21 DIAGNOSIS — H3531 Nonexudative age-related macular degeneration: Secondary | ICD-10-CM

## 2014-09-21 DIAGNOSIS — H35033 Hypertensive retinopathy, bilateral: Secondary | ICD-10-CM

## 2014-09-21 DIAGNOSIS — H43813 Vitreous degeneration, bilateral: Secondary | ICD-10-CM | POA: Diagnosis not present

## 2014-09-21 DIAGNOSIS — I10 Essential (primary) hypertension: Secondary | ICD-10-CM | POA: Diagnosis not present

## 2014-12-28 ENCOUNTER — Encounter (INDEPENDENT_AMBULATORY_CARE_PROVIDER_SITE_OTHER): Payer: Medicare Other | Admitting: Ophthalmology

## 2014-12-28 DIAGNOSIS — H3532 Exudative age-related macular degeneration: Secondary | ICD-10-CM

## 2014-12-28 DIAGNOSIS — I10 Essential (primary) hypertension: Secondary | ICD-10-CM | POA: Diagnosis not present

## 2014-12-28 DIAGNOSIS — H43813 Vitreous degeneration, bilateral: Secondary | ICD-10-CM | POA: Diagnosis not present

## 2014-12-28 DIAGNOSIS — H35033 Hypertensive retinopathy, bilateral: Secondary | ICD-10-CM

## 2014-12-28 DIAGNOSIS — E11319 Type 2 diabetes mellitus with unspecified diabetic retinopathy without macular edema: Secondary | ICD-10-CM | POA: Diagnosis not present

## 2014-12-28 DIAGNOSIS — E11329 Type 2 diabetes mellitus with mild nonproliferative diabetic retinopathy without macular edema: Secondary | ICD-10-CM | POA: Diagnosis not present

## 2014-12-28 DIAGNOSIS — H3531 Nonexudative age-related macular degeneration: Secondary | ICD-10-CM

## 2015-04-19 ENCOUNTER — Encounter (INDEPENDENT_AMBULATORY_CARE_PROVIDER_SITE_OTHER): Payer: Medicare Other | Admitting: Ophthalmology

## 2015-04-19 DIAGNOSIS — E11311 Type 2 diabetes mellitus with unspecified diabetic retinopathy with macular edema: Secondary | ICD-10-CM

## 2015-04-19 DIAGNOSIS — H353231 Exudative age-related macular degeneration, bilateral, with active choroidal neovascularization: Secondary | ICD-10-CM | POA: Diagnosis not present

## 2015-04-19 DIAGNOSIS — H43813 Vitreous degeneration, bilateral: Secondary | ICD-10-CM

## 2015-04-19 DIAGNOSIS — I10 Essential (primary) hypertension: Secondary | ICD-10-CM

## 2015-04-19 DIAGNOSIS — H35033 Hypertensive retinopathy, bilateral: Secondary | ICD-10-CM

## 2015-04-19 DIAGNOSIS — E113312 Type 2 diabetes mellitus with moderate nonproliferative diabetic retinopathy with macular edema, left eye: Secondary | ICD-10-CM | POA: Diagnosis not present

## 2015-04-19 DIAGNOSIS — E113211 Type 2 diabetes mellitus with mild nonproliferative diabetic retinopathy with macular edema, right eye: Secondary | ICD-10-CM | POA: Diagnosis not present

## 2015-05-28 ENCOUNTER — Ambulatory Visit (INDEPENDENT_AMBULATORY_CARE_PROVIDER_SITE_OTHER): Payer: Medicare Other

## 2015-05-28 ENCOUNTER — Ambulatory Visit (INDEPENDENT_AMBULATORY_CARE_PROVIDER_SITE_OTHER): Payer: Medicare Other | Admitting: Emergency Medicine

## 2015-05-28 VITALS — BP 108/74 | HR 65 | Temp 98.2°F | Resp 18 | Wt 163.2 lb

## 2015-05-28 DIAGNOSIS — M25562 Pain in left knee: Secondary | ICD-10-CM

## 2015-05-28 DIAGNOSIS — M545 Low back pain, unspecified: Secondary | ICD-10-CM

## 2015-05-28 DIAGNOSIS — S8992XA Unspecified injury of left lower leg, initial encounter: Secondary | ICD-10-CM

## 2015-05-28 DIAGNOSIS — M7989 Other specified soft tissue disorders: Secondary | ICD-10-CM | POA: Diagnosis not present

## 2015-05-28 DIAGNOSIS — Z23 Encounter for immunization: Secondary | ICD-10-CM

## 2015-05-28 MED ORDER — TRAMADOL HCL 50 MG PO TABS: 50.0000 mg | ORAL_TABLET | Freq: Four times a day (QID) | ORAL | Status: DC | PRN

## 2015-05-28 NOTE — Progress Notes (Signed)
Subjective:  Patient ID: Briana Martinez, female    DOB: Oct 18, 1921  Age: 81 y.o. MRN: [id number]  CC: Fall; Rash; and Immunizations   HPI Briana BurGeneva M Schwall presents   Patient fell 10 days ago and landed on her tail and is concerned that she has a cracker fracture of her coccyx. She has some pain with sitting. No difficulty walking.  Or bearing weight.  she has no low back pain. She has no radiation of pain numbness tingling or weakness in his lower extremities. She has no nausea vomiting or difficulty moving her stools or voiding  History Louie CasaGeneva has a past medical history of Cataract; Diabetes mellitus without complication (HCC); and Stroke (HCC).   She has past surgical history that includes Joint replacement and Abdominal hysterectomy.   Her  family history is not on file.  She   reports that she has never smoked. She does not have any smokeless tobacco history on file. Her alcohol and drug histories are not on file.  Outpatient Prescriptions Prior to Visit  Medication Sig Dispense Refill  . amLODipine (NORVASC) 5 MG tablet Take 5 mg by mouth daily.    Marland Kitchen. glipiZIDE (GLUCOTROL) 5 MG tablet Take 5 mg by mouth 3 (three) times daily.    . pravastatin (PRAVACHOL) 20 MG tablet Take 20 mg by mouth daily.    Marland Kitchen. aspirin 81 MG tablet Take 81 mg by mouth daily. Reported on 05/28/2015    . Multiple Vitamin (MULTI-VITAMIN PO) Take by mouth. Reported on 05/28/2015    . Multiple Vitamins-Minerals (PRESERVISION AREDS PO) Take by mouth 2 (two) times daily. Reported on 05/28/2015    . traMADol (ULTRAM) 50 MG tablet Take 1 tablet (50 mg total) by mouth every 6 (six) hours as needed for pain. (Patient not taking: Reported on 05/28/2015) 15 tablet 0   No facility-administered medications prior to visit.    Social History   Social History  . Marital Status: Widowed    Spouse Name: N/A  . Number of Children: N/A  . Years of Education: N/A   Social History Main Topics  . Smoking status: Never  Smoker   . Smokeless tobacco: None  . Alcohol Use: None  . Drug Use: None  . Sexual Activity: Not Asked   Other Topics Concern  . None   Social History Narrative     Review of Systems  Constitutional: Negative for fever, chills and appetite change.  HENT: Negative for congestion, ear pain, postnasal drip, sinus pressure and sore throat.   Eyes: Negative for pain and redness.  Respiratory: Negative for cough, shortness of breath and wheezing.   Cardiovascular: Negative for leg swelling.  Gastrointestinal: Negative for nausea, vomiting, abdominal pain, diarrhea, constipation and blood in stool.  Endocrine: Negative for polyuria.  Genitourinary: Negative for dysuria, urgency, frequency and flank pain.  Musculoskeletal: Negative for gait problem.  Skin: Negative for rash.  Neurological: Negative for weakness and headaches.  Psychiatric/Behavioral: Negative for confusion and decreased concentration. The patient is not nervous/anxious.     Objective:  BP 108/74 mmHg  Pulse 65  Temp(Src) 98.2 F (36.8 C) (Oral)  Resp 18  Wt 163 lb 3.2 oz (74.027 kg)  SpO2 98%  Physical Exam  Constitutional: She is oriented to person, place, and time. She appears well-developed and well-nourished.  HENT:  Head: Normocephalic and atraumatic.  Eyes: Conjunctivae are normal. Pupils are equal, round, and reactive to light.  Pulmonary/Chest: Effort normal.  Musculoskeletal: She exhibits no edema.  Lumbar back: She exhibits tenderness (She's tender over her coccyx).  Neurological: She is alert and oriented to person, place, and time.  Skin: Skin is dry.  Psychiatric: She has a normal mood and affect. Her behavior is normal. Thought content normal.      Assessment & Plan:   [place] was seen today for fall, rash and immunizations.  Diagnoses and all orders for this visit:  Midline low back pain without sciatica -     DG Sacrum/Coccyx; Future  Needs flu shot -     Flu Vaccine QUAD 36+  mos IM  Leg injury, left, initial encounter -     traMADol (ULTRAM) 50 MG tablet; Take 1 tablet (50 mg total) by mouth every 6 (six) hours as needed.  Knee pain, acute, left -     traMADol (ULTRAM) 50 MG tablet; Take 1 tablet (50 mg total) by mouth every 6 (six) hours as needed.  Left leg swelling -     traMADol (ULTRAM) 50 MG tablet; Take 1 tablet (50 mg total) by mouth every 6 (six) hours as needed.  I have changed Ms. Goldblatt's traMADol. I am also having her maintain her aspirin, pravastatin, glipiZIDE, amLODipine, Multiple Vitamin (MULTI-VITAMIN PO), Multiple Vitamins-Minerals (PRESERVISION AREDS PO), and aspirin.  Meds ordered this encounter  Medications  . aspirin 325 MG tablet    Sig: Take 325 mg by mouth daily.  . traMADol (ULTRAM) 50 MG tablet    Sig: Take 1 tablet (50 mg total) by mouth every 6 (six) hours as needed.    Dispense:  15 tablet    Refill:  0    Appropriate red flag conditions were discussed with the patient as well as actions that should be taken.  Patient expressed his understanding.  Follow-up: Return if symptoms worsen or fail to improve.  Carmelina Dane, MD   [facility] reading (PRIMARY) by  Dr. Dareen Piano.  negative.

## 2015-05-28 NOTE — Patient Instructions (Signed)
Tailbone Injury °The tailbone (coccyx) is the small bone at the lower end of the spine. A tailbone injury may involve stretched ligaments, bruising, or a broken bone (fracture). Tailbone injuries can be painful, and some may take a long time to heal. °CAUSES °This condition is often caused by falling and landing on the tailbone. Other causes include: °· Repeated strain or friction from actions such as rowing and bicycling. °· Childbirth. °In some cases, the cause may not be known. °RISK FACTORS °This condition is more common in women than in men. °SYMPTOMS °Symptoms of this condition include: °· Pain in the lower back, especially when sitting. °· Pain or difficulty when standing up from a sitting position. °· Bruising in the tailbone area. °· Painful bowel movements. °· In women, pain during intercourse. °DIAGNOSIS °This condition may be diagnosed based on your symptoms and a physical exam. X-rays may be taken if a fracture is suspected. You may also have other tests, such as a CT scan or MRI. °TREATMENT °This condition may be treated with medicines to help relieve your pain. Most tailbone injuries heal on their own in 4-6 weeks. However, recovery time may be longer if the injury involves a fracture. °HOME CARE INSTRUCTIONS °· Take medicines only as directed by your health care provider. °· If directed, apply ice to the injured area: °¨ Put ice in a plastic bag. °¨ Place a towel between your skin and the bag. °¨ Leave the ice on for 20 minutes, 2-3 times per day for the first 1-2 days. °· Sit on a large, rubber or inflated ring or cushion to ease your pain. Lean forward when you are sitting to help decrease discomfort. °· Avoid sitting for long periods of time. °· Increase your activity as the pain allows. Perform any exercises that are recommended by your health care provider or physical therapist. °· If you have pain during bowel movements, use stool softeners as directed by your health care provider. °· Eat a  diet that includes plenty of fiber to help prevent constipation. °· Keep all follow-up visits as directed by your health care provider. This is important. °PREVENTION °Wear appropriate padding and sports gear when bicycling and rowing. This can help to prevent developing an injury that is caused by repeated strain or friction. °SEEK MEDICAL CARE IF: °· Your pain becomes worse. °· Your bowel movements cause a great deal of discomfort. °· You are unable to have a bowel movement. °· You have uncontrolled urine loss (urinary incontinence). °· You have a fever. °  °This information is not intended to replace advice given to you by your health care provider. Make sure you discuss any questions you have with your health care provider. °  °Document Released: 05/16/2000 Document Revised: 10/03/2014 Document Reviewed: 05/15/2014 °Elsevier Interactive Patient Education ©2016 [employer] ° °

## 2015-08-09 ENCOUNTER — Encounter (INDEPENDENT_AMBULATORY_CARE_PROVIDER_SITE_OTHER): Payer: Medicare Other | Admitting: Ophthalmology

## 2015-08-09 DIAGNOSIS — H353231 Exudative age-related macular degeneration, bilateral, with active choroidal neovascularization: Secondary | ICD-10-CM

## 2015-08-09 DIAGNOSIS — H43813 Vitreous degeneration, bilateral: Secondary | ICD-10-CM

## 2015-08-09 DIAGNOSIS — E113293 Type 2 diabetes mellitus with mild nonproliferative diabetic retinopathy without macular edema, bilateral: Secondary | ICD-10-CM

## 2015-08-09 DIAGNOSIS — I10 Essential (primary) hypertension: Secondary | ICD-10-CM | POA: Diagnosis not present

## 2015-08-09 DIAGNOSIS — H35033 Hypertensive retinopathy, bilateral: Secondary | ICD-10-CM

## 2015-08-09 DIAGNOSIS — E11319 Type 2 diabetes mellitus with unspecified diabetic retinopathy without macular edema: Secondary | ICD-10-CM | POA: Diagnosis not present

## 2016-01-17 ENCOUNTER — Encounter (INDEPENDENT_AMBULATORY_CARE_PROVIDER_SITE_OTHER): Payer: Medicare Other | Admitting: Ophthalmology

## 2016-01-17 DIAGNOSIS — E113313 Type 2 diabetes mellitus with moderate nonproliferative diabetic retinopathy with macular edema, bilateral: Secondary | ICD-10-CM | POA: Diagnosis not present

## 2016-01-17 DIAGNOSIS — H35033 Hypertensive retinopathy, bilateral: Secondary | ICD-10-CM

## 2016-01-17 DIAGNOSIS — I10 Essential (primary) hypertension: Secondary | ICD-10-CM | POA: Diagnosis not present

## 2016-01-17 DIAGNOSIS — H353231 Exudative age-related macular degeneration, bilateral, with active choroidal neovascularization: Secondary | ICD-10-CM | POA: Diagnosis not present

## 2016-01-17 DIAGNOSIS — H43813 Vitreous degeneration, bilateral: Secondary | ICD-10-CM

## 2016-01-17 DIAGNOSIS — E11311 Type 2 diabetes mellitus with unspecified diabetic retinopathy with macular edema: Secondary | ICD-10-CM | POA: Diagnosis not present

## 2016-04-07 ENCOUNTER — Encounter (INDEPENDENT_AMBULATORY_CARE_PROVIDER_SITE_OTHER): Payer: Medicare Other | Admitting: Ophthalmology

## 2016-04-07 DIAGNOSIS — E113313 Type 2 diabetes mellitus with moderate nonproliferative diabetic retinopathy with macular edema, bilateral: Secondary | ICD-10-CM | POA: Diagnosis not present

## 2016-04-07 DIAGNOSIS — E11311 Type 2 diabetes mellitus with unspecified diabetic retinopathy with macular edema: Secondary | ICD-10-CM

## 2016-04-07 DIAGNOSIS — H43813 Vitreous degeneration, bilateral: Secondary | ICD-10-CM | POA: Diagnosis not present

## 2016-04-07 DIAGNOSIS — H353231 Exudative age-related macular degeneration, bilateral, with active choroidal neovascularization: Secondary | ICD-10-CM

## 2016-04-07 DIAGNOSIS — H35033 Hypertensive retinopathy, bilateral: Secondary | ICD-10-CM | POA: Diagnosis not present

## 2016-04-07 DIAGNOSIS — I10 Essential (primary) hypertension: Secondary | ICD-10-CM | POA: Diagnosis not present

## 2016-05-01 ENCOUNTER — Encounter (INDEPENDENT_AMBULATORY_CARE_PROVIDER_SITE_OTHER): Payer: Medicare Other | Admitting: Ophthalmology

## 2016-05-08 ENCOUNTER — Encounter (INDEPENDENT_AMBULATORY_CARE_PROVIDER_SITE_OTHER): Payer: Medicare Other | Admitting: Ophthalmology

## 2016-05-08 DIAGNOSIS — I10 Essential (primary) hypertension: Secondary | ICD-10-CM

## 2016-05-08 DIAGNOSIS — E11311 Type 2 diabetes mellitus with unspecified diabetic retinopathy with macular edema: Secondary | ICD-10-CM | POA: Diagnosis not present

## 2016-05-08 DIAGNOSIS — H353231 Exudative age-related macular degeneration, bilateral, with active choroidal neovascularization: Secondary | ICD-10-CM | POA: Diagnosis not present

## 2016-05-08 DIAGNOSIS — E113313 Type 2 diabetes mellitus with moderate nonproliferative diabetic retinopathy with macular edema, bilateral: Secondary | ICD-10-CM | POA: Diagnosis not present

## 2016-05-08 DIAGNOSIS — H35033 Hypertensive retinopathy, bilateral: Secondary | ICD-10-CM | POA: Diagnosis not present

## 2016-05-08 DIAGNOSIS — H43813 Vitreous degeneration, bilateral: Secondary | ICD-10-CM | POA: Diagnosis not present

## 2016-06-05 ENCOUNTER — Encounter (INDEPENDENT_AMBULATORY_CARE_PROVIDER_SITE_OTHER): Payer: Medicare Other | Admitting: Ophthalmology

## 2016-06-18 ENCOUNTER — Encounter (INDEPENDENT_AMBULATORY_CARE_PROVIDER_SITE_OTHER): Payer: Medicare Other | Admitting: Ophthalmology

## 2016-07-07 ENCOUNTER — Encounter (INDEPENDENT_AMBULATORY_CARE_PROVIDER_SITE_OTHER): Payer: Medicare Other | Admitting: Ophthalmology

## 2016-07-07 DIAGNOSIS — H35033 Hypertensive retinopathy, bilateral: Secondary | ICD-10-CM

## 2016-07-07 DIAGNOSIS — H43813 Vitreous degeneration, bilateral: Secondary | ICD-10-CM | POA: Diagnosis not present

## 2016-07-07 DIAGNOSIS — E11311 Type 2 diabetes mellitus with unspecified diabetic retinopathy with macular edema: Secondary | ICD-10-CM

## 2016-07-07 DIAGNOSIS — H353231 Exudative age-related macular degeneration, bilateral, with active choroidal neovascularization: Secondary | ICD-10-CM

## 2016-07-07 DIAGNOSIS — I10 Essential (primary) hypertension: Secondary | ICD-10-CM | POA: Diagnosis not present

## 2016-07-07 DIAGNOSIS — E113313 Type 2 diabetes mellitus with moderate nonproliferative diabetic retinopathy with macular edema, bilateral: Secondary | ICD-10-CM

## 2016-08-04 ENCOUNTER — Encounter (INDEPENDENT_AMBULATORY_CARE_PROVIDER_SITE_OTHER): Payer: Medicare Other | Admitting: Ophthalmology

## 2016-08-04 DIAGNOSIS — I10 Essential (primary) hypertension: Secondary | ICD-10-CM

## 2016-08-04 DIAGNOSIS — H353231 Exudative age-related macular degeneration, bilateral, with active choroidal neovascularization: Secondary | ICD-10-CM

## 2016-08-04 DIAGNOSIS — E11311 Type 2 diabetes mellitus with unspecified diabetic retinopathy with macular edema: Secondary | ICD-10-CM

## 2016-08-04 DIAGNOSIS — H35033 Hypertensive retinopathy, bilateral: Secondary | ICD-10-CM

## 2016-08-04 DIAGNOSIS — E113313 Type 2 diabetes mellitus with moderate nonproliferative diabetic retinopathy with macular edema, bilateral: Secondary | ICD-10-CM | POA: Diagnosis not present

## 2016-08-04 DIAGNOSIS — H43813 Vitreous degeneration, bilateral: Secondary | ICD-10-CM | POA: Diagnosis not present

## 2016-09-08 ENCOUNTER — Encounter (INDEPENDENT_AMBULATORY_CARE_PROVIDER_SITE_OTHER): Payer: Medicare Other | Admitting: Ophthalmology

## 2016-09-09 ENCOUNTER — Encounter (INDEPENDENT_AMBULATORY_CARE_PROVIDER_SITE_OTHER): Payer: Medicare Other | Admitting: Ophthalmology

## 2016-09-09 DIAGNOSIS — E11311 Type 2 diabetes mellitus with unspecified diabetic retinopathy with macular edema: Secondary | ICD-10-CM

## 2016-09-09 DIAGNOSIS — I10 Essential (primary) hypertension: Secondary | ICD-10-CM | POA: Diagnosis not present

## 2016-09-09 DIAGNOSIS — H43813 Vitreous degeneration, bilateral: Secondary | ICD-10-CM

## 2016-09-09 DIAGNOSIS — E113313 Type 2 diabetes mellitus with moderate nonproliferative diabetic retinopathy with macular edema, bilateral: Secondary | ICD-10-CM

## 2016-09-09 DIAGNOSIS — H353231 Exudative age-related macular degeneration, bilateral, with active choroidal neovascularization: Secondary | ICD-10-CM | POA: Diagnosis not present

## 2016-09-09 DIAGNOSIS — H35033 Hypertensive retinopathy, bilateral: Secondary | ICD-10-CM

## 2016-10-07 ENCOUNTER — Encounter (INDEPENDENT_AMBULATORY_CARE_PROVIDER_SITE_OTHER): Payer: Medicare Other | Admitting: Ophthalmology

## 2016-10-07 DIAGNOSIS — I10 Essential (primary) hypertension: Secondary | ICD-10-CM

## 2016-10-07 DIAGNOSIS — E11311 Type 2 diabetes mellitus with unspecified diabetic retinopathy with macular edema: Secondary | ICD-10-CM

## 2016-10-07 DIAGNOSIS — E113311 Type 2 diabetes mellitus with moderate nonproliferative diabetic retinopathy with macular edema, right eye: Secondary | ICD-10-CM

## 2016-10-07 DIAGNOSIS — H35033 Hypertensive retinopathy, bilateral: Secondary | ICD-10-CM | POA: Diagnosis not present

## 2016-10-07 DIAGNOSIS — H353231 Exudative age-related macular degeneration, bilateral, with active choroidal neovascularization: Secondary | ICD-10-CM | POA: Diagnosis not present

## 2016-10-07 DIAGNOSIS — E113392 Type 2 diabetes mellitus with moderate nonproliferative diabetic retinopathy without macular edema, left eye: Secondary | ICD-10-CM

## 2016-11-11 ENCOUNTER — Encounter (INDEPENDENT_AMBULATORY_CARE_PROVIDER_SITE_OTHER): Payer: Medicare Other | Admitting: Ophthalmology

## 2016-11-11 DIAGNOSIS — H353231 Exudative age-related macular degeneration, bilateral, with active choroidal neovascularization: Secondary | ICD-10-CM

## 2016-11-11 DIAGNOSIS — E113392 Type 2 diabetes mellitus with moderate nonproliferative diabetic retinopathy without macular edema, left eye: Secondary | ICD-10-CM

## 2016-11-11 DIAGNOSIS — H35033 Hypertensive retinopathy, bilateral: Secondary | ICD-10-CM | POA: Diagnosis not present

## 2016-11-11 DIAGNOSIS — H3509 Other intraretinal microvascular abnormalities: Secondary | ICD-10-CM

## 2016-11-11 DIAGNOSIS — I10 Essential (primary) hypertension: Secondary | ICD-10-CM

## 2016-11-11 DIAGNOSIS — E11311 Type 2 diabetes mellitus with unspecified diabetic retinopathy with macular edema: Secondary | ICD-10-CM | POA: Diagnosis not present

## 2016-11-11 DIAGNOSIS — E113311 Type 2 diabetes mellitus with moderate nonproliferative diabetic retinopathy with macular edema, right eye: Secondary | ICD-10-CM

## 2016-12-16 ENCOUNTER — Encounter (INDEPENDENT_AMBULATORY_CARE_PROVIDER_SITE_OTHER): Payer: Medicare Other | Admitting: Ophthalmology

## 2016-12-16 DIAGNOSIS — H353231 Exudative age-related macular degeneration, bilateral, with active choroidal neovascularization: Secondary | ICD-10-CM

## 2016-12-16 DIAGNOSIS — H43813 Vitreous degeneration, bilateral: Secondary | ICD-10-CM

## 2016-12-16 DIAGNOSIS — E113393 Type 2 diabetes mellitus with moderate nonproliferative diabetic retinopathy without macular edema, bilateral: Secondary | ICD-10-CM

## 2016-12-16 DIAGNOSIS — H35033 Hypertensive retinopathy, bilateral: Secondary | ICD-10-CM

## 2016-12-16 DIAGNOSIS — E11311 Type 2 diabetes mellitus with unspecified diabetic retinopathy with macular edema: Secondary | ICD-10-CM | POA: Diagnosis not present

## 2016-12-16 DIAGNOSIS — E113311 Type 2 diabetes mellitus with moderate nonproliferative diabetic retinopathy with macular edema, right eye: Secondary | ICD-10-CM | POA: Diagnosis not present

## 2016-12-16 DIAGNOSIS — I10 Essential (primary) hypertension: Secondary | ICD-10-CM | POA: Diagnosis not present

## 2017-01-27 ENCOUNTER — Encounter (INDEPENDENT_AMBULATORY_CARE_PROVIDER_SITE_OTHER): Payer: Medicare Other | Admitting: Ophthalmology

## 2017-01-27 DIAGNOSIS — E113311 Type 2 diabetes mellitus with moderate nonproliferative diabetic retinopathy with macular edema, right eye: Secondary | ICD-10-CM

## 2017-01-27 DIAGNOSIS — E11311 Type 2 diabetes mellitus with unspecified diabetic retinopathy with macular edema: Secondary | ICD-10-CM | POA: Diagnosis not present

## 2017-01-27 DIAGNOSIS — H35033 Hypertensive retinopathy, bilateral: Secondary | ICD-10-CM

## 2017-01-27 DIAGNOSIS — H43813 Vitreous degeneration, bilateral: Secondary | ICD-10-CM

## 2017-01-27 DIAGNOSIS — E113392 Type 2 diabetes mellitus with moderate nonproliferative diabetic retinopathy without macular edema, left eye: Secondary | ICD-10-CM | POA: Diagnosis not present

## 2017-01-27 DIAGNOSIS — H353231 Exudative age-related macular degeneration, bilateral, with active choroidal neovascularization: Secondary | ICD-10-CM

## 2017-01-27 DIAGNOSIS — I10 Essential (primary) hypertension: Secondary | ICD-10-CM | POA: Diagnosis not present

## 2017-03-10 ENCOUNTER — Encounter (INDEPENDENT_AMBULATORY_CARE_PROVIDER_SITE_OTHER): Payer: Medicare Other | Admitting: Ophthalmology

## 2017-03-10 DIAGNOSIS — H353231 Exudative age-related macular degeneration, bilateral, with active choroidal neovascularization: Secondary | ICD-10-CM

## 2017-03-10 DIAGNOSIS — E113311 Type 2 diabetes mellitus with moderate nonproliferative diabetic retinopathy with macular edema, right eye: Secondary | ICD-10-CM | POA: Diagnosis not present

## 2017-03-10 DIAGNOSIS — H43813 Vitreous degeneration, bilateral: Secondary | ICD-10-CM

## 2017-03-10 DIAGNOSIS — E113392 Type 2 diabetes mellitus with moderate nonproliferative diabetic retinopathy without macular edema, left eye: Secondary | ICD-10-CM | POA: Diagnosis not present

## 2017-03-10 DIAGNOSIS — E11311 Type 2 diabetes mellitus with unspecified diabetic retinopathy with macular edema: Secondary | ICD-10-CM | POA: Diagnosis not present

## 2017-03-10 DIAGNOSIS — H35033 Hypertensive retinopathy, bilateral: Secondary | ICD-10-CM | POA: Diagnosis not present

## 2017-03-10 DIAGNOSIS — I10 Essential (primary) hypertension: Secondary | ICD-10-CM | POA: Diagnosis not present

## 2017-04-21 ENCOUNTER — Encounter (INDEPENDENT_AMBULATORY_CARE_PROVIDER_SITE_OTHER): Payer: Medicare Other | Admitting: Ophthalmology

## 2017-04-21 DIAGNOSIS — H353231 Exudative age-related macular degeneration, bilateral, with active choroidal neovascularization: Secondary | ICD-10-CM | POA: Diagnosis not present

## 2017-04-21 DIAGNOSIS — E113311 Type 2 diabetes mellitus with moderate nonproliferative diabetic retinopathy with macular edema, right eye: Secondary | ICD-10-CM | POA: Diagnosis not present

## 2017-04-21 DIAGNOSIS — E113392 Type 2 diabetes mellitus with moderate nonproliferative diabetic retinopathy without macular edema, left eye: Secondary | ICD-10-CM | POA: Diagnosis not present

## 2017-04-21 DIAGNOSIS — I10 Essential (primary) hypertension: Secondary | ICD-10-CM

## 2017-04-21 DIAGNOSIS — H35033 Hypertensive retinopathy, bilateral: Secondary | ICD-10-CM

## 2017-04-21 DIAGNOSIS — E11311 Type 2 diabetes mellitus with unspecified diabetic retinopathy with macular edema: Secondary | ICD-10-CM

## 2017-04-21 DIAGNOSIS — H43813 Vitreous degeneration, bilateral: Secondary | ICD-10-CM

## 2017-05-22 ENCOUNTER — Encounter (INDEPENDENT_AMBULATORY_CARE_PROVIDER_SITE_OTHER): Payer: Medicare Other | Admitting: Ophthalmology

## 2017-05-22 DIAGNOSIS — E113311 Type 2 diabetes mellitus with moderate nonproliferative diabetic retinopathy with macular edema, right eye: Secondary | ICD-10-CM

## 2017-05-22 DIAGNOSIS — I10 Essential (primary) hypertension: Secondary | ICD-10-CM | POA: Diagnosis not present

## 2017-05-22 DIAGNOSIS — H35033 Hypertensive retinopathy, bilateral: Secondary | ICD-10-CM | POA: Diagnosis not present

## 2017-05-22 DIAGNOSIS — E113392 Type 2 diabetes mellitus with moderate nonproliferative diabetic retinopathy without macular edema, left eye: Secondary | ICD-10-CM | POA: Diagnosis not present

## 2017-05-22 DIAGNOSIS — H43813 Vitreous degeneration, bilateral: Secondary | ICD-10-CM

## 2017-05-22 DIAGNOSIS — H353231 Exudative age-related macular degeneration, bilateral, with active choroidal neovascularization: Secondary | ICD-10-CM | POA: Diagnosis not present

## 2017-05-22 DIAGNOSIS — E11311 Type 2 diabetes mellitus with unspecified diabetic retinopathy with macular edema: Secondary | ICD-10-CM

## 2017-07-03 ENCOUNTER — Encounter (INDEPENDENT_AMBULATORY_CARE_PROVIDER_SITE_OTHER): Payer: Medicare Other | Admitting: Ophthalmology

## 2017-07-03 DIAGNOSIS — E11311 Type 2 diabetes mellitus with unspecified diabetic retinopathy with macular edema: Secondary | ICD-10-CM | POA: Diagnosis not present

## 2017-07-03 DIAGNOSIS — I10 Essential (primary) hypertension: Secondary | ICD-10-CM | POA: Diagnosis not present

## 2017-07-03 DIAGNOSIS — H35033 Hypertensive retinopathy, bilateral: Secondary | ICD-10-CM

## 2017-07-03 DIAGNOSIS — H353231 Exudative age-related macular degeneration, bilateral, with active choroidal neovascularization: Secondary | ICD-10-CM

## 2017-07-03 DIAGNOSIS — E113311 Type 2 diabetes mellitus with moderate nonproliferative diabetic retinopathy with macular edema, right eye: Secondary | ICD-10-CM

## 2017-07-03 DIAGNOSIS — E113392 Type 2 diabetes mellitus with moderate nonproliferative diabetic retinopathy without macular edema, left eye: Secondary | ICD-10-CM

## 2017-07-03 DIAGNOSIS — H43813 Vitreous degeneration, bilateral: Secondary | ICD-10-CM

## 2017-08-11 ENCOUNTER — Encounter (INDEPENDENT_AMBULATORY_CARE_PROVIDER_SITE_OTHER): Payer: Medicare Other | Admitting: Ophthalmology

## 2017-08-11 DIAGNOSIS — E113291 Type 2 diabetes mellitus with mild nonproliferative diabetic retinopathy without macular edema, right eye: Secondary | ICD-10-CM

## 2017-08-11 DIAGNOSIS — H43813 Vitreous degeneration, bilateral: Secondary | ICD-10-CM | POA: Diagnosis not present

## 2017-08-11 DIAGNOSIS — E11319 Type 2 diabetes mellitus with unspecified diabetic retinopathy without macular edema: Secondary | ICD-10-CM

## 2017-08-11 DIAGNOSIS — I10 Essential (primary) hypertension: Secondary | ICD-10-CM

## 2017-08-11 DIAGNOSIS — H35033 Hypertensive retinopathy, bilateral: Secondary | ICD-10-CM | POA: Diagnosis not present

## 2017-08-11 DIAGNOSIS — E113392 Type 2 diabetes mellitus with moderate nonproliferative diabetic retinopathy without macular edema, left eye: Secondary | ICD-10-CM | POA: Diagnosis not present

## 2017-08-11 DIAGNOSIS — H353231 Exudative age-related macular degeneration, bilateral, with active choroidal neovascularization: Secondary | ICD-10-CM

## 2017-09-15 ENCOUNTER — Encounter (INDEPENDENT_AMBULATORY_CARE_PROVIDER_SITE_OTHER): Payer: Medicare Other | Admitting: Ophthalmology

## 2017-09-15 DIAGNOSIS — I10 Essential (primary) hypertension: Secondary | ICD-10-CM | POA: Diagnosis not present

## 2017-09-15 DIAGNOSIS — H43813 Vitreous degeneration, bilateral: Secondary | ICD-10-CM | POA: Diagnosis not present

## 2017-09-15 DIAGNOSIS — E113293 Type 2 diabetes mellitus with mild nonproliferative diabetic retinopathy without macular edema, bilateral: Secondary | ICD-10-CM

## 2017-09-15 DIAGNOSIS — H35033 Hypertensive retinopathy, bilateral: Secondary | ICD-10-CM | POA: Diagnosis not present

## 2017-09-15 DIAGNOSIS — E11319 Type 2 diabetes mellitus with unspecified diabetic retinopathy without macular edema: Secondary | ICD-10-CM

## 2017-09-15 DIAGNOSIS — H353231 Exudative age-related macular degeneration, bilateral, with active choroidal neovascularization: Secondary | ICD-10-CM | POA: Diagnosis not present

## 2017-11-03 ENCOUNTER — Encounter (INDEPENDENT_AMBULATORY_CARE_PROVIDER_SITE_OTHER): Payer: Medicare Other | Admitting: Ophthalmology

## 2017-11-03 DIAGNOSIS — I10 Essential (primary) hypertension: Secondary | ICD-10-CM | POA: Diagnosis not present

## 2017-11-03 DIAGNOSIS — H43813 Vitreous degeneration, bilateral: Secondary | ICD-10-CM

## 2017-11-03 DIAGNOSIS — H353231 Exudative age-related macular degeneration, bilateral, with active choroidal neovascularization: Secondary | ICD-10-CM | POA: Diagnosis not present

## 2017-11-03 DIAGNOSIS — H35033 Hypertensive retinopathy, bilateral: Secondary | ICD-10-CM | POA: Diagnosis not present

## 2017-11-03 DIAGNOSIS — E113293 Type 2 diabetes mellitus with mild nonproliferative diabetic retinopathy without macular edema, bilateral: Secondary | ICD-10-CM

## 2017-11-03 DIAGNOSIS — E11319 Type 2 diabetes mellitus with unspecified diabetic retinopathy without macular edema: Secondary | ICD-10-CM | POA: Diagnosis not present

## 2017-12-22 ENCOUNTER — Encounter (INDEPENDENT_AMBULATORY_CARE_PROVIDER_SITE_OTHER): Payer: Medicare Other | Admitting: Ophthalmology

## 2017-12-22 DIAGNOSIS — H35033 Hypertensive retinopathy, bilateral: Secondary | ICD-10-CM

## 2017-12-22 DIAGNOSIS — H353231 Exudative age-related macular degeneration, bilateral, with active choroidal neovascularization: Secondary | ICD-10-CM

## 2017-12-22 DIAGNOSIS — I10 Essential (primary) hypertension: Secondary | ICD-10-CM | POA: Diagnosis not present

## 2017-12-22 DIAGNOSIS — H43813 Vitreous degeneration, bilateral: Secondary | ICD-10-CM

## 2017-12-22 DIAGNOSIS — E113293 Type 2 diabetes mellitus with mild nonproliferative diabetic retinopathy without macular edema, bilateral: Secondary | ICD-10-CM

## 2017-12-22 DIAGNOSIS — E10311 Type 1 diabetes mellitus with unspecified diabetic retinopathy with macular edema: Secondary | ICD-10-CM

## 2018-02-16 ENCOUNTER — Encounter (INDEPENDENT_AMBULATORY_CARE_PROVIDER_SITE_OTHER): Payer: Medicare Other | Admitting: Ophthalmology

## 2018-02-16 DIAGNOSIS — E11311 Type 2 diabetes mellitus with unspecified diabetic retinopathy with macular edema: Secondary | ICD-10-CM | POA: Diagnosis not present

## 2018-02-16 DIAGNOSIS — H43813 Vitreous degeneration, bilateral: Secondary | ICD-10-CM

## 2018-02-16 DIAGNOSIS — I10 Essential (primary) hypertension: Secondary | ICD-10-CM | POA: Diagnosis not present

## 2018-02-16 DIAGNOSIS — E113392 Type 2 diabetes mellitus with moderate nonproliferative diabetic retinopathy without macular edema, left eye: Secondary | ICD-10-CM

## 2018-02-16 DIAGNOSIS — H353231 Exudative age-related macular degeneration, bilateral, with active choroidal neovascularization: Secondary | ICD-10-CM | POA: Diagnosis not present

## 2018-02-16 DIAGNOSIS — H35033 Hypertensive retinopathy, bilateral: Secondary | ICD-10-CM

## 2018-02-16 DIAGNOSIS — E113311 Type 2 diabetes mellitus with moderate nonproliferative diabetic retinopathy with macular edema, right eye: Secondary | ICD-10-CM

## 2018-04-20 ENCOUNTER — Encounter (INDEPENDENT_AMBULATORY_CARE_PROVIDER_SITE_OTHER): Payer: Medicare Other | Admitting: Ophthalmology

## 2018-04-20 DIAGNOSIS — I10 Essential (primary) hypertension: Secondary | ICD-10-CM

## 2018-04-20 DIAGNOSIS — H353231 Exudative age-related macular degeneration, bilateral, with active choroidal neovascularization: Secondary | ICD-10-CM

## 2018-04-20 DIAGNOSIS — E113311 Type 2 diabetes mellitus with moderate nonproliferative diabetic retinopathy with macular edema, right eye: Secondary | ICD-10-CM

## 2018-04-20 DIAGNOSIS — H35033 Hypertensive retinopathy, bilateral: Secondary | ICD-10-CM

## 2018-04-20 DIAGNOSIS — E11311 Type 2 diabetes mellitus with unspecified diabetic retinopathy with macular edema: Secondary | ICD-10-CM | POA: Diagnosis not present

## 2018-04-20 DIAGNOSIS — E113392 Type 2 diabetes mellitus with moderate nonproliferative diabetic retinopathy without macular edema, left eye: Secondary | ICD-10-CM

## 2018-04-20 DIAGNOSIS — H43813 Vitreous degeneration, bilateral: Secondary | ICD-10-CM

## 2018-06-02 DIAGNOSIS — R109 Unspecified abdominal pain: Secondary | ICD-10-CM

## 2018-06-02 HISTORY — DX: Unspecified abdominal pain: R10.9

## 2018-06-06 ENCOUNTER — Inpatient Hospital Stay (HOSPITAL_COMMUNITY)
Admission: EM | Admit: 2018-06-06 | Discharge: 2018-06-14 | DRG: 381 | Disposition: A | Discharge status: Skilled Nursing Facility | Payer: Medicare Other | Attending: Internal Medicine | Admitting: Internal Medicine

## 2018-06-06 ENCOUNTER — Encounter (HOSPITAL_COMMUNITY): Payer: Self-pay | Admitting: Emergency Medicine

## 2018-06-06 ENCOUNTER — Other Ambulatory Visit: Payer: Self-pay

## 2018-06-06 DIAGNOSIS — R198 Other specified symptoms and signs involving the digestive system and abdomen: Secondary | ICD-10-CM

## 2018-06-06 DIAGNOSIS — I4430 Unspecified atrioventricular block: Secondary | ICD-10-CM

## 2018-06-06 DIAGNOSIS — K5730 Diverticulosis of large intestine without perforation or abscess without bleeding: Secondary | ICD-10-CM | POA: Diagnosis present

## 2018-06-06 DIAGNOSIS — E871 Hypo-osmolality and hyponatremia: Secondary | ICD-10-CM | POA: Diagnosis present

## 2018-06-06 DIAGNOSIS — Z90710 Acquired absence of both cervix and uterus: Secondary | ICD-10-CM

## 2018-06-06 DIAGNOSIS — R1310 Dysphagia, unspecified: Secondary | ICD-10-CM | POA: Diagnosis present

## 2018-06-06 DIAGNOSIS — M1711 Unilateral primary osteoarthritis, right knee: Secondary | ICD-10-CM | POA: Diagnosis present

## 2018-06-06 DIAGNOSIS — K219 Gastro-esophageal reflux disease without esophagitis: Secondary | ICD-10-CM | POA: Diagnosis present

## 2018-06-06 DIAGNOSIS — Z90711 Acquired absence of uterus with remaining cervical stump: Secondary | ICD-10-CM

## 2018-06-06 DIAGNOSIS — R8271 Bacteriuria: Secondary | ICD-10-CM | POA: Diagnosis present

## 2018-06-06 DIAGNOSIS — Z7984 Long term (current) use of oral hypoglycemic drugs: Secondary | ICD-10-CM

## 2018-06-06 DIAGNOSIS — K255 Chronic or unspecified gastric ulcer with perforation: Secondary | ICD-10-CM | POA: Diagnosis not present

## 2018-06-06 DIAGNOSIS — I10 Essential (primary) hypertension: Secondary | ICD-10-CM

## 2018-06-06 DIAGNOSIS — I441 Atrioventricular block, second degree: Secondary | ICD-10-CM

## 2018-06-06 DIAGNOSIS — Z7982 Long term (current) use of aspirin: Secondary | ICD-10-CM

## 2018-06-06 DIAGNOSIS — E860 Dehydration: Secondary | ICD-10-CM | POA: Diagnosis not present

## 2018-06-06 DIAGNOSIS — Z96659 Presence of unspecified artificial knee joint: Secondary | ICD-10-CM | POA: Diagnosis present

## 2018-06-06 DIAGNOSIS — Z66 Do not resuscitate: Secondary | ICD-10-CM | POA: Diagnosis not present

## 2018-06-06 DIAGNOSIS — I4891 Unspecified atrial fibrillation: Secondary | ICD-10-CM | POA: Diagnosis present

## 2018-06-06 DIAGNOSIS — I4729 Other ventricular tachycardia: Secondary | ICD-10-CM

## 2018-06-06 DIAGNOSIS — K631 Perforation of intestine (nontraumatic): Secondary | ICD-10-CM

## 2018-06-06 DIAGNOSIS — R1013 Epigastric pain: Secondary | ICD-10-CM

## 2018-06-06 DIAGNOSIS — R109 Unspecified abdominal pain: Secondary | ICD-10-CM | POA: Diagnosis present

## 2018-06-06 DIAGNOSIS — I472 Ventricular tachycardia: Secondary | ICD-10-CM | POA: Diagnosis not present

## 2018-06-06 DIAGNOSIS — R001 Bradycardia, unspecified: Secondary | ICD-10-CM

## 2018-06-06 DIAGNOSIS — Z8673 Personal history of transient ischemic attack (TIA), and cerebral infarction without residual deficits: Secondary | ICD-10-CM

## 2018-06-06 DIAGNOSIS — H548 Legal blindness, as defined in USA: Secondary | ICD-10-CM | POA: Diagnosis present

## 2018-06-06 DIAGNOSIS — K314 Gastric diverticulum: Secondary | ICD-10-CM | POA: Diagnosis present

## 2018-06-06 DIAGNOSIS — D72829 Elevated white blood cell count, unspecified: Secondary | ICD-10-CM | POA: Diagnosis present

## 2018-06-06 DIAGNOSIS — Z515 Encounter for palliative care: Secondary | ICD-10-CM

## 2018-06-06 DIAGNOSIS — Z7189 Other specified counseling: Secondary | ICD-10-CM

## 2018-06-06 DIAGNOSIS — E876 Hypokalemia: Secondary | ICD-10-CM | POA: Diagnosis not present

## 2018-06-06 DIAGNOSIS — M25561 Pain in right knee: Secondary | ICD-10-CM

## 2018-06-06 DIAGNOSIS — E119 Type 2 diabetes mellitus without complications: Secondary | ICD-10-CM | POA: Diagnosis present

## 2018-06-06 HISTORY — DX: Unspecified abdominal pain: R10.9

## 2018-06-06 HISTORY — DX: Adverse effect of unspecified anesthetic, initial encounter: T41.45XA

## 2018-06-06 HISTORY — DX: Unspecified atrioventricular block: I44.30

## 2018-06-06 HISTORY — DX: Other complications of anesthesia, initial encounter: T88.59XA

## 2018-06-06 NOTE — ED Triage Notes (Signed)
C/o epigastric burning with nausea and vomiting since 12/17.  Doesn't currently take anything for acid reflux.  Pain worse at night after eating.

## 2018-06-07 ENCOUNTER — Emergency Department (HOSPITAL_COMMUNITY): Payer: Medicare Other

## 2018-06-07 ENCOUNTER — Inpatient Hospital Stay (HOSPITAL_COMMUNITY): Payer: Medicare Other

## 2018-06-07 DIAGNOSIS — I4430 Unspecified atrioventricular block: Secondary | ICD-10-CM | POA: Diagnosis not present

## 2018-06-07 DIAGNOSIS — D72829 Elevated white blood cell count, unspecified: Secondary | ICD-10-CM | POA: Diagnosis present

## 2018-06-07 DIAGNOSIS — I4891 Unspecified atrial fibrillation: Secondary | ICD-10-CM | POA: Diagnosis present

## 2018-06-07 DIAGNOSIS — Z7984 Long term (current) use of oral hypoglycemic drugs: Secondary | ICD-10-CM | POA: Diagnosis not present

## 2018-06-07 DIAGNOSIS — M1711 Unilateral primary osteoarthritis, right knee: Secondary | ICD-10-CM | POA: Diagnosis present

## 2018-06-07 DIAGNOSIS — R1013 Epigastric pain: Secondary | ICD-10-CM | POA: Diagnosis present

## 2018-06-07 DIAGNOSIS — R001 Bradycardia, unspecified: Secondary | ICD-10-CM

## 2018-06-07 DIAGNOSIS — Z90711 Acquired absence of uterus with remaining cervical stump: Secondary | ICD-10-CM | POA: Diagnosis not present

## 2018-06-07 DIAGNOSIS — I361 Nonrheumatic tricuspid (valve) insufficiency: Secondary | ICD-10-CM | POA: Diagnosis not present

## 2018-06-07 DIAGNOSIS — Z7189 Other specified counseling: Secondary | ICD-10-CM | POA: Diagnosis not present

## 2018-06-07 DIAGNOSIS — I441 Atrioventricular block, second degree: Secondary | ICD-10-CM | POA: Diagnosis present

## 2018-06-07 DIAGNOSIS — Z8673 Personal history of transient ischemic attack (TIA), and cerebral infarction without residual deficits: Secondary | ICD-10-CM | POA: Diagnosis not present

## 2018-06-07 DIAGNOSIS — Z90710 Acquired absence of both cervix and uterus: Secondary | ICD-10-CM | POA: Diagnosis not present

## 2018-06-07 DIAGNOSIS — E871 Hypo-osmolality and hyponatremia: Secondary | ICD-10-CM | POA: Diagnosis present

## 2018-06-07 DIAGNOSIS — R8271 Bacteriuria: Secondary | ICD-10-CM | POA: Diagnosis not present

## 2018-06-07 DIAGNOSIS — Z96659 Presence of unspecified artificial knee joint: Secondary | ICD-10-CM | POA: Diagnosis present

## 2018-06-07 DIAGNOSIS — R1010 Upper abdominal pain, unspecified: Secondary | ICD-10-CM | POA: Diagnosis not present

## 2018-06-07 DIAGNOSIS — Z66 Do not resuscitate: Secondary | ICD-10-CM | POA: Diagnosis not present

## 2018-06-07 DIAGNOSIS — E860 Dehydration: Secondary | ICD-10-CM | POA: Diagnosis not present

## 2018-06-07 DIAGNOSIS — E876 Hypokalemia: Secondary | ICD-10-CM | POA: Diagnosis not present

## 2018-06-07 DIAGNOSIS — K631 Perforation of intestine (nontraumatic): Secondary | ICD-10-CM | POA: Diagnosis not present

## 2018-06-07 DIAGNOSIS — K255 Chronic or unspecified gastric ulcer with perforation: Secondary | ICD-10-CM | POA: Diagnosis present

## 2018-06-07 DIAGNOSIS — R1310 Dysphagia, unspecified: Secondary | ICD-10-CM | POA: Diagnosis present

## 2018-06-07 DIAGNOSIS — Z515 Encounter for palliative care: Secondary | ICD-10-CM | POA: Diagnosis not present

## 2018-06-07 DIAGNOSIS — K5730 Diverticulosis of large intestine without perforation or abscess without bleeding: Secondary | ICD-10-CM | POA: Diagnosis present

## 2018-06-07 DIAGNOSIS — K219 Gastro-esophageal reflux disease without esophagitis: Secondary | ICD-10-CM | POA: Diagnosis present

## 2018-06-07 DIAGNOSIS — K3189 Other diseases of stomach and duodenum: Secondary | ICD-10-CM | POA: Diagnosis not present

## 2018-06-07 DIAGNOSIS — R109 Unspecified abdominal pain: Secondary | ICD-10-CM | POA: Diagnosis present

## 2018-06-07 DIAGNOSIS — K314 Gastric diverticulum: Secondary | ICD-10-CM | POA: Diagnosis present

## 2018-06-07 DIAGNOSIS — H548 Legal blindness, as defined in USA: Secondary | ICD-10-CM | POA: Diagnosis present

## 2018-06-07 DIAGNOSIS — I472 Ventricular tachycardia: Secondary | ICD-10-CM | POA: Diagnosis not present

## 2018-06-07 DIAGNOSIS — I10 Essential (primary) hypertension: Secondary | ICD-10-CM | POA: Diagnosis not present

## 2018-06-07 DIAGNOSIS — E119 Type 2 diabetes mellitus without complications: Secondary | ICD-10-CM | POA: Diagnosis present

## 2018-06-07 DIAGNOSIS — Z7982 Long term (current) use of aspirin: Secondary | ICD-10-CM | POA: Diagnosis not present

## 2018-06-07 LAB — CBC
HCT: 32 % — ABNORMAL LOW (ref 36.0–46.0)
HEMOGLOBIN: 10.4 g/dL — AB (ref 12.0–15.0)
MCH: 30.1 pg (ref 26.0–34.0)
MCHC: 32.5 g/dL (ref 30.0–36.0)
MCV: 92.8 fL (ref 80.0–100.0)
PLATELETS: 438 10*3/uL — AB (ref 150–400)
RBC: 3.45 MIL/uL — ABNORMAL LOW (ref 3.87–5.11)
RDW: 12.6 % (ref 11.5–15.5)
WBC: 13.2 10*3/uL — ABNORMAL HIGH (ref 4.0–10.5)
nRBC: 0 % (ref 0.0–0.2)

## 2018-06-07 LAB — COMPREHENSIVE METABOLIC PANEL
ALBUMIN: 3 g/dL — AB (ref 3.5–5.0)
ALK PHOS: 53 U/L (ref 38–126)
ALT: 14 U/L (ref 0–44)
AST: 14 U/L — ABNORMAL LOW (ref 15–41)
Anion gap: 12 (ref 5–15)
BILIRUBIN TOTAL: 0.5 mg/dL (ref 0.3–1.2)
BUN: 18 mg/dL (ref 8–23)
CALCIUM: 9.8 mg/dL (ref 8.9–10.3)
CO2: 25 mmol/L (ref 22–32)
Chloride: 91 mmol/L — ABNORMAL LOW (ref 98–111)
Creatinine, Ser: 0.84 mg/dL (ref 0.44–1.00)
GFR calc Af Amer: >60 mL/min (ref >60)
GFR calc non Af Amer: 59 mL/min — ABNORMAL LOW (ref >60)
GLUCOSE: 208 mg/dL — AB (ref 70–99)
Potassium: 4.1 mmol/L (ref 3.5–5.1)
SODIUM: 128 mmol/L — AB (ref 135–145)
TOTAL PROTEIN: 7.4 g/dL (ref 6.5–8.1)

## 2018-06-07 LAB — TROPONIN I: Troponin I: <0.03 ng/mL (ref <0.03)

## 2018-06-07 LAB — URINALYSIS, ROUTINE W REFLEX MICROSCOPIC
Bilirubin Urine: NEGATIVE
Glucose, UA: 50 mg/dL — AB
HGB URINE DIPSTICK: NEGATIVE
Ketones, ur: 5 mg/dL — AB
Nitrite: NEGATIVE
PROTEIN: 30 mg/dL — AB
SPECIFIC GRAVITY, URINE: 1.014 (ref 1.005–1.030)
pH: 5 (ref 5.0–8.0)

## 2018-06-07 LAB — TSH: TSH: 1.687 u[IU]/mL (ref 0.350–4.500)

## 2018-06-07 LAB — LIPASE, BLOOD: Lipase: 102 U/L — ABNORMAL HIGH (ref 11–51)

## 2018-06-07 LAB — I-STAT TROPONIN, ED: Troponin i, poc: 0 ng/mL (ref 0.00–0.08)

## 2018-06-07 LAB — GLUCOSE, CAPILLARY: Glucose-Capillary: 77 mg/dL (ref 70–99)

## 2018-06-07 MED ORDER — PANTOPRAZOLE SODIUM 40 MG IV SOLR: 40.0000 mg | Freq: Two times a day (BID) | INTRAVENOUS | Status: DC

## 2018-06-07 MED ORDER — PIPERACILLIN-TAZOBACTAM 3.375 G IVPB
3.3750 g | Freq: Three times a day (TID) | INTRAVENOUS | Status: DC
  Administered 2018-06-07 – 2018-06-13 (×17): 3.375 g via INTRAVENOUS
  Filled 2018-06-07 (×20): qty 50

## 2018-06-07 MED ORDER — SODIUM CHLORIDE 0.9 % IV BOLUS
500.0000 mL | Freq: Once | INTRAVENOUS | Status: AC
  Administered 2018-06-07: 500 mL via INTRAVENOUS

## 2018-06-07 MED ORDER — SODIUM CHLORIDE 0.9 % IV SOLN
8.0000 mg/h | INTRAVENOUS | Status: AC
  Administered 2018-06-07 – 2018-06-10 (×6): 8 mg/h via INTRAVENOUS
  Filled 2018-06-07 (×8): qty 80

## 2018-06-07 MED ORDER — PANTOPRAZOLE SODIUM 40 MG IV SOLR
40.0000 mg | Freq: Once | INTRAVENOUS | Status: AC
  Administered 2018-06-07: 40 mg via INTRAVENOUS
  Filled 2018-06-07: qty 40

## 2018-06-07 MED ORDER — ACETAMINOPHEN 325 MG PO TABS: 650.0000 mg | ORAL_TABLET | Freq: Four times a day (QID) | ORAL | Status: DC | PRN

## 2018-06-07 MED ORDER — DEXTROSE-NACL 5-0.9 % IV SOLN
INTRAVENOUS | Status: DC
  Administered 2018-06-07 – 2018-06-11 (×8): via INTRAVENOUS

## 2018-06-07 MED ORDER — IOHEXOL 300 MG/ML  SOLN
100.0000 mL | Freq: Once | INTRAMUSCULAR | Status: AC | PRN
  Administered 2018-06-07: 100 mL via INTRAVENOUS

## 2018-06-07 MED ORDER — ONDANSETRON HCL 4 MG PO TABS: 4.0000 mg | ORAL_TABLET | Freq: Four times a day (QID) | ORAL | Status: DC | PRN

## 2018-06-07 MED ORDER — ONDANSETRON HCL 4 MG/2ML IJ SOLN
4.0000 mg | Freq: Once | INTRAMUSCULAR | Status: AC
  Administered 2018-06-07: 4 mg via INTRAVENOUS
  Filled 2018-06-07: qty 2

## 2018-06-07 MED ORDER — ONDANSETRON HCL 4 MG/2ML IJ SOLN: 4.0000 mg | Freq: Four times a day (QID) | INTRAMUSCULAR | Status: DC | PRN

## 2018-06-07 MED ORDER — PIPERACILLIN-TAZOBACTAM 3.375 G IVPB
3.3750 g | Freq: Once | INTRAVENOUS | Status: AC
  Administered 2018-06-07: 3.375 g via INTRAVENOUS
  Filled 2018-06-07: qty 50

## 2018-06-07 MED ORDER — ACETAMINOPHEN 650 MG RE SUPP: 650.0000 mg | Freq: Four times a day (QID) | RECTAL | Status: DC | PRN

## 2018-06-07 MED ORDER — IOHEXOL 300 MG/ML  SOLN
150.0000 mL | Freq: Once | INTRAMUSCULAR | Status: AC | PRN
  Administered 2018-06-07: 150 mL via ORAL

## 2018-06-07 MED ORDER — SODIUM CHLORIDE 0.9 % IV SOLN
80.0000 mg | Freq: Once | INTRAVENOUS | Status: AC
  Administered 2018-06-07: 80 mg via INTRAVENOUS
  Filled 2018-06-07: qty 80

## 2018-06-07 MED ORDER — DEXTROSE 5 % IV SOLN: INTRAVENOUS | Status: DC

## 2018-06-07 MED ORDER — ALBUTEROL SULFATE (2.5 MG/3ML) 0.083% IN NEBU: 2.5000 mg | INHALATION_SOLUTION | Freq: Four times a day (QID) | RESPIRATORY_TRACT | Status: DC | PRN

## 2018-06-07 MED ORDER — SODIUM CHLORIDE 0.9 % IV SOLN
INTRAVENOUS | Status: DC
  Administered 2018-06-07: 09:00:00 via INTRAVENOUS

## 2018-06-07 NOTE — ED Notes (Signed)
Pt is

## 2018-06-07 NOTE — Progress Notes (Addendum)
Shift event: RN paged because pt's HR was falling into the 30s and 12 lead read 2nd deg AVB. She had an episode of SaO2 falling when HR low, but is normal now on 3L O2. BP 160s. RRRN was called to assess and NP went to room to see the pt. S: She says she feels fine. Has no dizziness or chest pain. O: Well appearing elderly WF in NAD. She arouses easily. HR is in the 30s when NP enters room, but rises to the 60s when pt is awakened. SaO2 is normal now. Card: brady with 2nd deg AVB and some irregularity noted on tele. Resp: normal effort. Neuro: no focal deficits.  A/P:  1. Episodic brady with 2nd deg AVB, more with sleeping. Immediate recovery when awakened. She is asymptomtic. Will continue to watch for now. Asked RN to call NP if HR is sustained less than 30 or if pt becomes symptomatic. BP stable. She is not on BBs at home or here.  Will follow.  KJKG, NP [facility]

## 2018-06-07 NOTE — ED Notes (Signed)
No dinner tray ordered Pt is NPO

## 2018-06-07 NOTE — ED Notes (Signed)
Patient transported to X-ray 

## 2018-06-07 NOTE — H&P (Addendum)
History and Physical    Briana Martinez OQH:[id number] DOB: [birth year] DOA: 06/06/2018  Referring MD/NP/PA: Delaine Lame, MD PCP: Kendell Bane, MD  Patient coming from: home by private vehicle   Chief Complaint: Abdominal pain  I have personally briefly reviewed patient's old medical records in [facility]   HPI: Briana Martinez is a 85 y.o. female with medical history significant of CVA and DM type II; who presents with complaints of 2 weeks of epigastric abdominal pain.  History is limited as patient is hard of hearing.  Family present in room helps provide additional history.  She normally can eat oatmeal for breath breakfast without any problems.  However, after eating lunch and dinner patient reports having soreness feeling stomach that progressively worsens and nausea.  If she cannot vomit she will stick her finger down her throat to make herself vomit to help relieve symptoms.  Emesis is usually of whatever she ate last, nonbloody, and nonbilious in appearance.  Patient did not try anything to relieve symptoms.  She has felt general malaise and weakness due to her symptoms.  Her last bowel movement  was possibly 4 days ago after taking multiple laxatives.  Denies any fever, chills, diarrhea, chest pain, shortness of breath, recent trauma, falls, or dysuria.  ED Course: Upon admission to the emergency department patient was seen to be afebrile, pulse 57-88, respirations 18, blood pressure 124/52-152/58, O2 saturations 88-100% on room air.  Labs revealed WBC 13.2, hemoglobin 10.4, sodium 128, chloride 91, glucose 2 8, and lipase 102.  CT scan of the abdomen pelvis gave concern for inflammation around the lesser curvature of the stomach concerning for perforation, diverticulum, or peptic ulcer.  General surgery was consulted due to patient symptoms.  Patient was given 500 mL of normal saline IV fluids, Zofran, and Zosyn.  TRH called to admit.  Review of Systems  Constitutional: Positive for  malaise/fatigue. Negative for chills and fever.  HENT: Positive for hearing loss.   Eyes: Negative for photophobia and pain.  Respiratory: Negative for cough and shortness of breath.   Cardiovascular: Negative for chest pain and leg swelling.  Gastrointestinal: Positive for abdominal pain, nausea and vomiting.  Genitourinary: Negative for dysuria and frequency.  Musculoskeletal: Negative for falls.  Neurological: Negative for loss of consciousness and weakness.  Psychiatric/Behavioral: Negative for substance abuse.  All other systems reviewed and are negative.   Past Medical History:  Diagnosis Date  . Cataract   . Diabetes mellitus without complication (HCC)   . Stroke [facility])     Past Surgical History:  Procedure Laterality Date  . ABDOMINAL HYSTERECTOMY    . JOINT REPLACEMENT       reports that she has never smoked. She has never used smokeless tobacco. She reports previous alcohol use. She reports that she does not use drugs.  No Known Allergies  No significant family history known.  Prior to Admission medications   Medication Sig Start Date End Date Taking? Authorizing Provider  amLODipine (NORVASC) 5 MG tablet Take 5 mg by mouth daily.   Yes [provider]  aspirin 325 MG tablet Take 325 mg by mouth daily.   Yes [provider]  cholecalciferol (VITAMIN D3) 25 MCG (1000 UT) tablet Take 2,000 Units by mouth daily.   Yes [provider]  losartan (COZAAR) 50 MG tablet Take 50 mg by mouth daily.   Yes [provider]  Multiple Vitamin (MULTI-VITAMIN PO) Take 1 tablet by mouth daily. Reported on 05/28/2015  Yes [provider]  Multiple Vitamins-Minerals (PRESERVISION AREDS PO) Take by mouth 2 (two) times daily. Reported on 05/28/2015   Yes [provider]  pravastatin (PRAVACHOL) 20 MG tablet Take 20 mg by mouth daily.   Yes [provider]  traMADol (ULTRAM) 50 MG tablet Take 1 tablet (50 mg total) by mouth  every 6 (six) hours as needed. Patient taking differently: Take 50 mg by mouth every 6 (six) hours as needed for moderate pain.  05/28/15  Yes Carmelina Dane, MD    Physical Exam:  Constitutional: Elderly female currently in NAD, calm, comfortable Vitals:   06/07/18 0313 06/07/18 0314 06/07/18 0315 06/07/18 0400  BP:   (!) 124/50 (!) 130/45  Pulse: 70 (!) 59 63 66  Resp:      Temp:      SpO2: 96% 100% 99% 99%   Eyes: PERRL, lids and conjunctivae normal ENMT: Mucous membranes are moist.  Hard of hearing Neck: normal, supple, no masses, no thyromegaly Respiratory: clear to auscultation bilaterally, no wheezing, no crackles. Normal respiratory effort. No accessory muscle use.  Cardiovascular: Irregular irregular, no murmurs / rubs / gallops. No extremity edema. 2+ pedal pulses. No carotid bruits.  Abdomen: Epigastric tenderness to palpation.  No guarding or rebound tenderness noted.  Bowel sounds mildly decreased Musculoskeletal: no clubbing / cyanosis. No joint deformity upper and lower extremities. Good ROM, no contractures. Normal muscle tone.  Skin: no rashes, lesions, ulcers. No induration Neurologic: CN 2-12 grossly intact. Sensation intact, DTR normal. Strength 5/5 in all 4.  Psychiatric: Normal judgment and insight. Alert and oriented x 3. Normal mood.     Labs on Admission: I have personally reviewed following labs and imaging studies  CBC: Recent Labs  Lab 06/06/18 2356  WBC 13.2*  HGB 10.4*  HCT 32.0*  MCV 92.8  PLT 438*   Basic Metabolic Panel: Recent Labs  Lab 06/06/18 2356  NA 128*  K 4.1  CL 91*  CO2 25  GLUCOSE 208*  BUN 18  CREATININE 0.84  CALCIUM 9.8   GFR: CrCl cannot be calculated (Unknown ideal weight.). Liver Function Tests: Recent Labs  Lab 06/06/18 2356  AST 14*  ALT 14  ALKPHOS 53  BILITOT 0.5  PROT 7.4  ALBUMIN 3.0*   Recent Labs  Lab 06/06/18 2356  LIPASE 102*   No results for input(s): AMMONIA in the last 168  hours. Coagulation Profile: No results for input(s): INR, PROTIME in the last 168 hours. Cardiac Enzymes: No results for input(s): CKTOTAL, CKMB, CKMBINDEX, TROPONINI in the last 168 hours. BNP (last 3 results) No results for input(s): PROBNP in the last 8760 hours. HbA1C: No results for input(s): HGBA1C in the last 72 hours. CBG: No results for input(s): GLUCAP in the last 168 hours. Lipid Profile: No results for input(s): CHOL, HDL, LDLCALC, TRIG, CHOLHDL, LDLDIRECT in the last 72 hours. Thyroid Function Tests: No results for input(s): TSH, T4TOTAL, FREET4, T3FREE, THYROIDAB in the last 72 hours. Anemia Panel: No results for input(s): VITAMINB12, FOLATE, FERRITIN, TIBC, IRON, RETICCTPCT in the last 72 hours. Urine analysis:    Component Value Date/Time   COLORURINE YELLOW 06/07/2018 0000   APPEARANCEUR HAZY (A) 06/07/2018 0000   LABSPEC 1.014 06/07/2018 0000   PHURINE 5.0 06/07/2018 0000   GLUCOSEU 50 (A) 06/07/2018 0000   HGBUR NEGATIVE 06/07/2018 0000   BILIRUBINUR NEGATIVE 06/07/2018 0000   KETONESUR 5 (A) 06/07/2018 0000   PROTEINUR 30 (A) 06/07/2018 0000   NITRITE NEGATIVE 06/07/2018  0000   LEUKOCYTESUR MODERATE (A) 06/07/2018 0000   Sepsis Labs: No results found for this or any previous visit (from the past 240 hour(s)).   Radiological Exams on Admission: Ct Abdomen Pelvis W Contrast  Result Date: 06/07/2018 CLINICAL DATA:  85 year old with acute abdominal pain. Nausea and vomiting for weeks. EXAM: CT ABDOMEN AND PELVIS WITH CONTRAST TECHNIQUE: Multidetector CT imaging of the abdomen and pelvis was performed using the standard protocol following bolus administration of intravenous contrast. CONTRAST:  100mL OMNIPAQUE IOHEXOL 300 MG/ML  SOLN COMPARISON:  Report from abdominal CT 03/31/2014 at an outside institution reviewed in Care Everywhere, images not available. FINDINGS: Lower chest: Ovoid paravertebral lesion at the right lung base is homogeneous attenuation with  well-defined lobulated contours, extra vertebral component measuring 3.6 x 2.0 x 3.9 cm and extending into the right T11-T12 neural foramen with chronic osseous remottling. Component extends into the spinal canal. There is associated atelectasis in the right lower lobe. Coronary artery calcifications. Calcified noncalcified atheromatous plaque involving the descending thoracic aorta. Hepatobiliary: Focal hepatic lesion. Gallbladder filled with stones. No pericholecystic inflammation. No biliary dilatation. Pancreas: No ductal dilatation or inflammation. Spleen: Normal in size without focal abnormality. Adrenals/Urinary Tract: Mild right adrenal thickening without dominant nodule. The left adrenal gland is normal. No hydronephrosis or perinephric edema. Homogeneous renal enhancement with symmetric excretion on delayed phase imaging. Small cyst in the upper left kidney. Urinary bladder is partially distended without wall thickening. Stomach/Bowel: Small hiatal hernia. There is a rounded outpouching of fluid from the lesser curvature of the stomach with the neck of 11 mm, image 23 series 3. Significant normal appendix. Moderate to large colonic stool burden. No colonic wall thickening or inflammatory change. Sigmoid diverticulosis without diverticulitis. Surrounding fat stranding and inflammatory change. Mild associated gastric wall thickening. The small bowel is decompressed without inflammatory change. Vascular/Lymphatic: Calcified noncalcified irregular plaque throughout the lower thoracic and abdominal aorta. No aneurysm. Mesenteric vessels appear patent. Portal vein is patent. There is prominent left adnexal vascularity and dilatation of the left ovarian vein to 6 mm. No adenopathy. Reproductive: Post hysterectomy. Prominent left adnexal vascularity with dilatation of the ovarian vein at 6 mm. No adnexal mass. Other: Small amount of free fluid in the pelvis. Suspect small amount of fluid in the lesser sac adjacent  gastric inflammation. No free air or loculated abscess. Musculoskeletal: Multilevel degenerative change in the lumbar spine. Grade 1 anterolisthesis of L5 on S1 is likely facet mediated. Perivertebral mass in the lower thorax as described above. IMPRESSION: 1. Fluid-filled gastric outpouching from the lesser curvature. There is significant surrounding inflammatory change. Findings are suspicious for early or contained gastric perforation, possible peptic ulcer disease, less likely gastric diverticulum which is inflamed. No extraluminal air. Recommend surgical consultation. 2. Chronic findings include gallstones and colonic diverticulosis without diverticulitis. Small hiatal hernia. 3. 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 This was described on outside prior exam. 4. Possible pelvic congestion. 5.  Aortic Atherosclerosis (ICD10-I70.0). These results were called by telephone at the time of interpretation on 06/07/2018 at 3:09 am to Dr. Geoffery LyonsUGLAS DELO , who verbally acknowledged these results. Electronically Signed   By: Narda RutherfordMelanie  Sanford M.D.   On: 06/07/2018 03:10    EKG: Independently reviewed.  Atrial fibrillation 80 bpm LVH  Assessment/Plan Epigastric abdominal pain 2/2 possible gastric diverticulum/contained perforation/peptic ulcer: Acute.  Patient presents with complaints of 2 weeks of epigastric abdominal pain.  CT scan fluid-filled gastric outpouching from the lesser curvature of  the stomach.  General surgery consulted due to the possibility of a contained gastric perforation. - Admit to a progressive bed - N.p.o. - Pepcid IV twice daily - Antibiotics Zosyn IV - Normal saline IV fluids and 75 mL/h  Leukocytosis WBC elevated at 13.  Suspect secondary to above. - Recheck CBC in a.m.  Atrial fibrillation: Patient appears to be in atrial fibrillation with heart rates controlled.  Not on anticoagulation.  Unclear previous history of this.CHA2DS2-VASc score = 7.   - Hold  anticoagulation due to possible need of procedure  Asymptomatic bacteriuria: Urinalysis revealed moderate leukocytes, rare bacteria, and greater than 50 WBCs.  Patient denies complaints of dysuria or urinary frequency. - Follow-up and urine culture  Hyponatremia: Acute. Initial sodium 128 on admission.  Patient was initially given 500 mL bolus. 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 - Further work-up and/or evaluation may be warranted  DVT prophylaxis: SCDs Code Status: Full Family Communication: This plan of care with the patient family present at bedside Disposition Plan: TBD  Consults called: Surgery  Admission status: inpatient   Clydie Braunondell A Martian MD [facility] Pager [phone]   If 7PM-7AM, please contact night-coverage [email or web address] Password [email or web address]  06/07/2018, 4:35 AM

## 2018-06-07 NOTE — Significant Event (Signed)
Rapid Response Event Note  Overview:Called d/t pts HR-30s, SpO2-80s Time Called: 2005 Arrival Time: 2207 Event Type: Respiratory, Cardiac  Initial Focused Assessment: Patient laying in bed, asleep.  Pt will awaken to voice, is alert and oriented, c/o no dizziness, SOB, or chest pain. Skin warm and dry. Lungs clear, diminished in bases. HR-30s(2nd degree Heart block, type I), ST-419-62(22), RR-18, SpO2-80% on RA.  Once awakened, pt's HR increased to 40-60s(SB), with SpO2 increasing to 100%.  Pt placed on 2L [place] while sleeping.  Interventions: 12 lead EKG-confirmed 2nd degree HB, type I 2L [place] Frequent BP monitoring Plan of Care (if not transferred): Christean Grief, NP of heart rhythm(notes in chart say afib with slow ventricular response). Monitor BP more frequently. Call RRT if further assistance needed. Event Summary: Name of Physician Notified: Craige Cotta, NP at 2200(PTA RRT)    at    Outcome: Stayed in room and stabalized  Event End Time: 2230  Terrilyn Saver

## 2018-06-07 NOTE — Progress Notes (Signed)
EKG 12 leads is sinus bradycardia with 2nd degree AV block, HR 30-40. BP 169/18mmHg. Pt is asymptomatic, sleeping comfortably. Her SPO2 drops to 65-75% when her HR drop to 30. NCL 3 LPM given.  Notified MD on call hospitalist via pager. Called RRT for 2nd opinion and evaluation at bedside. Continue to monitor.  Zackery Barefoot, PCCN-CMC

## 2018-06-07 NOTE — ED Provider Notes (Signed)
[facility] EMERGENCY DEPARTMENT Provider Note   CSN: [id number] Arrival date & time: 06/06/18  2332     History   Chief Complaint Chief Complaint  Patient presents with  . Abdominal Pain  . Emesis    HPI Briana Martinez is a 85 y.o. female.  Patient is a 85 year old female with past medical history of diabetes, prior CVA, hysterectomy.  She presents today for evaluation of vomiting.  She reports not feeling well for the past 2 weeks.  When she attempts to eat she states that she begins to feel nauseated and has had several episodes of vomiting.  She feels well in the morning, however this feeling worsens as the day goes on.  She denies any bloody stool or vomit.  She does report constipation, however this is not abnormal for her.  She denies any fevers, chills, or ill contacts.  The history is provided by the patient.  Abdominal Pain   This is a new problem. Episode onset: 2 weeks ago. The problem occurs constantly. The problem has been gradually worsening. The pain is associated with eating. The pain is located in the generalized abdominal region. The quality of the pain is cramping. The pain is moderate. Associated symptoms include nausea and vomiting. Pertinent negatives include diarrhea, constipation, dysuria and hematuria. The symptoms are aggravated by eating. Nothing relieves the symptoms.  Emesis   Associated symptoms include abdominal pain. Pertinent negatives include no diarrhea.    Past Medical History:  Diagnosis Date  . Cataract   . Diabetes mellitus without complication (HCC)   . Stroke [facility])     There are no active problems to display for this patient.   Past Surgical History:  Procedure Laterality Date  . ABDOMINAL HYSTERECTOMY    . JOINT REPLACEMENT       OB History   No obstetric history on file.      Home Medications    Prior to Admission medications   Medication Sig Start Date End Date Taking? Authorizing Provider  amLODipine  (NORVASC) 5 MG tablet Take 5 mg by mouth daily.    [provider]  aspirin 325 MG tablet Take 325 mg by mouth daily.    [provider]  aspirin 81 MG tablet Take 81 mg by mouth daily. Reported on 05/28/2015    [provider]  glipiZIDE (GLUCOTROL) 5 MG tablet Take 5 mg by mouth 3 (three) times daily.    [provider]  Multiple Vitamin (MULTI-VITAMIN PO) Take by mouth. Reported on 05/28/2015    [provider]  Multiple Vitamins-Minerals (PRESERVISION AREDS PO) Take by mouth 2 (two) times daily. Reported on 05/28/2015    [provider]  pravastatin (PRAVACHOL) 20 MG tablet Take 20 mg by mouth daily.    [provider]  traMADol (ULTRAM) 50 MG tablet Take 1 tablet (50 mg total) by mouth every 6 (six) hours as needed. 05/28/15   Carmelina Dane, MD    Family History No family history on file.  Social History Social History   Tobacco Use  . Smoking status: Never Smoker  . Smokeless tobacco: Never Used  Substance Use Topics  . Alcohol use: Not Currently  . Drug use: Never     Allergies   Patient has no known allergies.   Review of Systems Review of Systems  Gastrointestinal: Positive for abdominal pain, nausea and vomiting. Negative for constipation and diarrhea.  Genitourinary: Negative for dysuria and hematuria.  All other systems  reviewed and are negative.    Physical Exam Updated Vital Signs BP (!) 128/101 (BP Location: Right Arm)   Pulse 88   Temp 97.6 F (36.4 C)   Resp 18   SpO2 98%   Physical Exam Vitals signs and nursing note reviewed.  Constitutional:      General: She is not in acute distress.    Appearance: She is well-developed. She is not diaphoretic.  HENT:     Head: Normocephalic and atraumatic.  Neck:     Musculoskeletal: Normal range of motion and neck supple.  Cardiovascular:     Rate and Rhythm: Normal rate and regular rhythm.     Heart sounds: No murmur. No friction rub.  No gallop.   Pulmonary:     Effort: Pulmonary effort is normal. No respiratory distress.     Breath sounds: Normal breath sounds. No wheezing.  Abdominal:     General: Bowel sounds are normal. There is no distension.     Palpations: Abdomen is soft.     Tenderness: There is abdominal tenderness in the epigastric area. There is no guarding or rebound.     Comments: There is mild tenderness in the epigastric region.  Musculoskeletal: Normal range of motion.  Skin:    General: Skin is warm and dry.  Neurological:     Mental Status: She is alert and oriented to person, place, and time.      ED Treatments / Results  Labs (all labs ordered are listed, but only abnormal results are displayed) Labs Reviewed  CBC - Abnormal; Notable for the following components:      Result Value   WBC 13.2 (*)    RBC 3.45 (*)    Hemoglobin 10.4 (*)    HCT 32.0 (*)    Platelets 438 (*)    All other components within normal limits  LIPASE, BLOOD  COMPREHENSIVE METABOLIC PANEL  URINALYSIS, ROUTINE W REFLEX MICROSCOPIC  I-STAT TROPONIN, ED    EKG None  Radiology No results found.  Procedures Procedures (including critical care time)  Medications Ordered in ED Medications  ondansetron (ZOFRAN) injection 4 mg (has no administration in time range)  sodium chloride 0.9 % bolus 500 mL (has no administration in time range)     Initial Impression / Assessment and Plan / ED Course  I have reviewed the triage vital signs and the nursing notes.  Pertinent labs & imaging results that were available during my care of the patient were reviewed by me and considered in my medical decision making (see chart for details).  Patient presenting with abdominal pain with eating for the past 2 weeks.  Her work-up reveals a white count of 13,000 along with a pocket of inflammation off of the lesser curvature of the stomach.  Radiology believes this to be either an inflamed gastric diverticulum or possibly a  contained perforated ulcer.  This finding was discussed with general surgery, Dr. Andrey Campanile who recommends Zosyn, Protonix, and admission to the hospitalist service.  I spoke with Dr. Katrinka Blazing who agrees to admit.  Final Clinical Impressions(s) / ED Diagnoses   Final diagnoses:  None    ED Discharge Orders    None       Geoffery Lyons, MD 06/07/18 [phone]

## 2018-06-07 NOTE — Progress Notes (Addendum)
Patient seen and examined  85 y.o. female with medical history significant of CVA and DM type II; who presents with complaints of 2 weeks of epigastric abdominal pain UGI series has shown Deep gastric ulcer/contained perforation along the posterior wall of the stomach,perforation appears to be contained. Patient has been seen by general surgery. At this time they recommend PPI,. No indication for emergent surgery. will discuss final plan of care the family, she cannot have EGD due to her impending perforation, told daughter she may have an  underlying malignancy She also has new onset atrial fibrillation with a slow ventricular response, and may need preop clearance Given her age and comorbidities, feel that she is not a surgical candidate and is likely appropriate for consideration of all palliative care consult Daughter has not yet expressed desire to switch her to a DNR

## 2018-06-07 NOTE — ED Notes (Signed)
Pt assisted going to the bathroom . Pt walk 20' with assistance

## 2018-06-07 NOTE — Consult Note (Addendum)
Reason for Consult:possible gastric perforation  Referring Physician: Dr Judd Lienelo  Briana Martinez is an 85 y.o. female.  HPI: 85 year old female came to the emergency room because of worsening epigastric pain for the past 2 weeks.  It is associated with nausea and vomiting.  She has a self-induced emesis.  She states that the epigastric pain has been intermittent in nature since December 17.  She is generally able to eat a small meal at breakfast but does not have much of an appetite throughout the day.  She will get nauseous and feel like she has to vomit and will manually induce vomiting.  Her symptoms are worse in the evening.  She denies any NSAID use.  She has chronic constipation and generally has to take 2 laxatives per day.  Her last bowel movement was around Wednesday.  Only abdominal surgery has been a partial hysterectomy.  Denies hematemesis.  No melena hematochezia.  She states that she is lost about 17 pounds since all this started.  For the past several years she is also complained of intermittent epigastric burning as well.  She has had 2 episodes of choking where she is required the Heimlich maneuver over the past few years.  She lives independently.  She stopped driving about 6 weeks ago.  She states that she is legally blind.  Past Medical History:  Diagnosis Date  . Cataract   . Diabetes mellitus without complication (HCC)   . Stroke [facility](HCC)     Past Surgical History:  Procedure Laterality Date  . ABDOMINAL HYSTERECTOMY    . JOINT REPLACEMENT      No family history on file.  Social History:  reports that she has never smoked. She has never used smokeless tobacco. She reports previous alcohol use. She reports that she does not use drugs.  Allergies: No Known Allergies  Medications: I have reviewed the patient's current medications.  Results for orders placed or performed during the hospital encounter of 06/06/18 (from the past 48 hour(s))  Lipase, blood     Status:  Abnormal   Collection Time: 06/06/18 11:56 PM  Result Value Ref Range   Lipase 102 (H) 11 - 51 U/L    Comment: Performed at [facility], [address] [place], [place] [address]  Comprehensive metabolic panel     Status: Abnormal   Collection Time: 06/06/18 11:56 PM  Result Value Ref Range   Sodium 128 (L) 135 - 145 mmol/L   Potassium 4.1 3.5 - 5.1 mmol/L   Chloride 91 (L) 98 - 111 mmol/L   CO2 25 22 - 32 mmol/L   Glucose, Bld 208 (H) 70 - 99 mg/dL   BUN 18 8 - 23 mg/dL   Creatinine, Ser 6.040.84 0.44 - 1.00 mg/dL   Calcium 9.8 8.9 - 54.010.3 mg/dL   Total Protein 7.4 6.5 - 8.1 g/dL   Albumin 3.0 (L) 3.5 - 5.0 g/dL   AST 14 (L) 15 - 41 U/L   ALT 14 0 - 44 U/L   Alkaline Phosphatase 53 38 - 126 U/L   Total Bilirubin 0.5 0.3 - 1.2 mg/dL   GFR calc non Af Amer 59 (L) >60 mL/min   GFR calc Af Amer >60 >60 mL/min   Anion gap 12 5 - 15    Comment: Performed at [facility], [address] [place], [place] [address]  CBC     Status: Abnormal   Collection Time: 06/06/18 11:56 PM  Result Value Ref  Range   WBC 13.2 (H) 4.0 - 10.5 K/uL   RBC 3.45 (L) 3.87 - 5.11 MIL/uL   Hemoglobin 10.4 (L) 12.0 - 15.0 g/dL   HCT 12.8 (L) 78.6 - 76.7 %   MCV 92.8 80.0 - 100.0 fL   MCH 30.1 26.0 - 34.0 pg   MCHC 32.5 30.0 - 36.0 g/dL   RDW 20.9 47.0 - 96.2 %   Platelets 438 (H) 150 - 400 K/uL   nRBC 0.0 0.0 - 0.2 %    Comment: Performed at [facility], [address] [place], [place] [address]  Urinalysis, Routine w reflex microscopic     Status: Abnormal   Collection Time: 06/07/18 12:00 AM  Result Value Ref Range   Color, Urine YELLOW YELLOW   APPearance HAZY (A) CLEAR   Specific Gravity, Urine 1.014 1.005 - 1.030   pH 5.0 5.0 - 8.0   Glucose, UA 50 (A) NEGATIVE mg/dL   Hgb urine dipstick NEGATIVE NEGATIVE   Bilirubin Urine NEGATIVE NEGATIVE   Ketones, ur 5 (A) NEGATIVE mg/dL   Protein, ur 30 (A) NEGATIVE mg/dL   Nitrite NEGATIVE NEGATIVE   Leukocytes, UA MODERATE (A)  NEGATIVE   RBC / HPF 0-5 0 - 5 RBC/hpf   WBC, UA >50 (H) 0 - 5 WBC/hpf   Bacteria, UA RARE (A) NONE SEEN   Mucus PRESENT     Comment: Performed at [facility], [address], [place], [place] [address]  I-stat troponin, ED     Status: None   Collection Time: 06/07/18 12:00 AM  Result Value Ref Range   Troponin i, poc 0.00 0.00 - 0.08 ng/mL   Comment 3            Comment: Due to the release kinetics of cTnI, a negative result within the first hours of the onset of symptoms does not rule out myocardial infarction with certainty. If myocardial infarction is still suspected, repeat the test at appropriate intervals.     Ct Abdomen Pelvis W Contrast  Result Date: 06/07/2018 CLINICAL DATA:  85 year old with acute abdominal pain. Nausea and vomiting for weeks. EXAM: CT ABDOMEN AND PELVIS WITH CONTRAST TECHNIQUE: Multidetector CT imaging of the abdomen and pelvis was performed using the standard protocol following bolus administration of intravenous contrast. CONTRAST:  OMNIPAQUE IOHEXOL 300 MG/ML  SOLN COMPARISON:  Report from abdominal CT 03/31/2014 at an outside institution reviewed in Care Everywhere, images not available. FINDINGS: Lower chest: Ovoid paravertebral lesion at the right lung base is homogeneous attenuation with well-defined lobulated contours, extra vertebral component measuring 3.6 x 2.0 x 3.9 cm and extending into the right T11-T12 neural foramen with chronic osseous remottling. Component extends into the spinal canal. There is associated atelectasis in the right lower lobe. Coronary artery calcifications. Calcified noncalcified atheromatous plaque involving the descending thoracic aorta. Hepatobiliary: Focal hepatic lesion. Gallbladder filled with stones. No pericholecystic inflammation. No biliary dilatation. Pancreas: No ductal dilatation or inflammation. Spleen: Normal in size without focal abnormality. Adrenals/Urinary Tract: Mild right adrenal thickening without  dominant nodule. The left adrenal gland is normal. No hydronephrosis or perinephric edema. Homogeneous renal enhancement with symmetric excretion on delayed phase imaging. Small cyst in the upper left kidney. Urinary bladder is partially distended without wall thickening. Stomach/Bowel: Small hiatal hernia. There is a rounded outpouching of fluid from the lesser curvature of the stomach with the neck of 11 mm, image 23 series 3. Significant normal appendix. Moderate to large colonic stool burden.  No colonic wall thickening or inflammatory change. Sigmoid diverticulosis without diverticulitis. Surrounding fat stranding and inflammatory change. Mild associated gastric wall thickening. The small bowel is decompressed without inflammatory change. Vascular/Lymphatic: Calcified noncalcified irregular plaque throughout the lower thoracic and abdominal aorta. No aneurysm. Mesenteric vessels appear patent. Portal vein is patent. There is prominent left adnexal vascularity and dilatation of the left ovarian vein to 6 mm. No adenopathy. Reproductive: Post hysterectomy. Prominent left adnexal vascularity with dilatation of the ovarian vein at 6 mm. No adnexal mass. Other: Small amount of free fluid in the pelvis. Suspect small amount of fluid in the lesser sac adjacent gastric inflammation. No free air or loculated abscess. Musculoskeletal: Multilevel degenerative change in the lumbar spine. Grade 1 anterolisthesis of L5 on S1 is likely facet mediated. Perivertebral mass in the lower thorax as described above. IMPRESSION: 1. Fluid-filled gastric outpouching from the lesser curvature. There is significant surrounding inflammatory change. Findings are suspicious for early or contained gastric perforation, possible peptic ulcer disease, less likely gastric diverticulum which is inflamed. No extraluminal air. Recommend surgical consultation. 2. Chronic findings include gallstones and colonic diverticulosis without diverticulitis.  Small hiatal hernia. 3. 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 This was described on outside prior exam. 4. Possible pelvic congestion. 5.  Aortic Atherosclerosis (ICD10-I70.0). These results were called by telephone at the time of interpretation on 06/07/2018 at 3:09 am to Dr. Geoffery LyonsUGLAS DELO , who verbally acknowledged these results. Electronically Signed   By: Narda RutherfordMelanie  Sanford M.D.   On: 06/07/2018 03:10    Review of Systems  All other systems reviewed and are negative.  Blood pressure (!) 115/42, pulse (!) 56, temperature 97.6 F (36.4 C), resp. rate 18, SpO2 97 %. Physical Exam  Vitals reviewed. Constitutional: She is oriented to person, place, and time. She appears well-developed and well-nourished. No distress.  HENT:  Head: Normocephalic and atraumatic.  Right Ear: External ear normal.  Left Ear: External ear normal.  Eyes: Conjunctivae are normal. No scleral icterus.  Neck: Normal range of motion. Neck supple. No tracheal deviation present. No thyromegaly present.  Cardiovascular: Normal rate and normal heart sounds.  Respiratory: Effort normal and breath sounds normal. No stridor. No respiratory distress. She has no wheezes.  GI: Soft. She exhibits no distension. There is no abdominal tenderness. There is no rebound and no guarding.  Musculoskeletal:        General: No tenderness or edema.  Lymphadenopathy:    She has no cervical adenopathy.  Neurological: She is alert and oriented to person, place, and time. She exhibits normal muscle tone.  Skin: Skin is warm and dry. No rash noted. She is not diaphoretic. No erythema. No pallor.  Psychiatric: She has a normal mood and affect. Her behavior is normal. Judgment and thought content normal.    Assessment/Plan: Epigastric pain with nausea and vomiting Leukocytosis Contained perforated gastric ulcer versus perforated gastric diverticulum versus gastric tumor Hypertension Diabetes mellitus History of  CVA  On CT imaging this gastric abnormality along the lesser curve appears a little bit atypical for an ulcer in my opinion.  The gastric diverticulum would also be atypical as well.  Nonetheless it appears to be a contained perforation.  There is no free air.  The patient is nontoxic.  She is afebrile.  She is not tachycardic.  Her abdominal exam is benign.  Recommend medicine admission N.p.o. We will plan upper GI with water-soluble contrast to rule out active leak.  Can  also evaluate distal esophagus for any type of benign stricture given her history of intermittent dysphagia Empiric antibiotics Twice daily PPI  If upper GI is negative for leak then I would probably recommend adding Carafate 4 times a day liquid  We will follow  Mary Sella. Andrey Campanile, MD, FACS General, Bariatric, & Minimally Invasive Surgery [facility]   Gaynelle Adu 06/07/2018, 6:51 AM

## 2018-06-08 ENCOUNTER — Inpatient Hospital Stay (HOSPITAL_COMMUNITY): Payer: Medicare Other

## 2018-06-08 ENCOUNTER — Encounter (HOSPITAL_COMMUNITY): Payer: Self-pay | Admitting: General Practice

## 2018-06-08 ENCOUNTER — Other Ambulatory Visit: Payer: Self-pay

## 2018-06-08 DIAGNOSIS — I361 Nonrheumatic tricuspid (valve) insufficiency: Secondary | ICD-10-CM

## 2018-06-08 LAB — BASIC METABOLIC PANEL
Anion gap: 8 (ref 5–15)
BUN: 11 mg/dL (ref 8–23)
CO2: 25 mmol/L (ref 22–32)
Calcium: 8.6 mg/dL — ABNORMAL LOW (ref 8.9–10.3)
Chloride: 105 mmol/L (ref 98–111)
Creatinine, Ser: 1.03 mg/dL — ABNORMAL HIGH (ref 0.44–1.00)
GFR calc Af Amer: 54 mL/min — ABNORMAL LOW (ref >60)
GFR calc non Af Amer: 46 mL/min — ABNORMAL LOW (ref >60)
Glucose, Bld: 103 mg/dL — ABNORMAL HIGH (ref 70–99)
POTASSIUM: 3.6 mmol/L (ref 3.5–5.1)
Sodium: 138 mmol/L (ref 135–145)

## 2018-06-08 LAB — TROPONIN I

## 2018-06-08 LAB — MAGNESIUM: Magnesium: 1.8 mg/dL (ref 1.7–2.4)

## 2018-06-08 LAB — CBC
HCT: 28.8 % — ABNORMAL LOW (ref 36.0–46.0)
Hemoglobin: 9 g/dL — ABNORMAL LOW (ref 12.0–15.0)
MCH: 29.9 pg (ref 26.0–34.0)
MCHC: 31.3 g/dL (ref 30.0–36.0)
MCV: 95.7 fL (ref 80.0–100.0)
Platelets: 342 10*3/uL (ref 150–400)
RBC: 3.01 MIL/uL — ABNORMAL LOW (ref 3.87–5.11)
RDW: 12.9 % (ref 11.5–15.5)
WBC: 6.7 10*3/uL (ref 4.0–10.5)
nRBC: 0 % (ref 0.0–0.2)

## 2018-06-08 LAB — ECHOCARDIOGRAM COMPLETE: Weight: 2246.93 oz

## 2018-06-08 LAB — GLUCOSE, CAPILLARY
GLUCOSE-CAPILLARY: 143 mg/dL — AB (ref 70–99)
Glucose-Capillary: 106 mg/dL — ABNORMAL HIGH (ref 70–99)
Glucose-Capillary: 118 mg/dL — ABNORMAL HIGH (ref 70–99)
Glucose-Capillary: 138 mg/dL — ABNORMAL HIGH (ref 70–99)

## 2018-06-08 NOTE — Plan of Care (Signed)
  Problem: Nutrition: NPO due to gastric perforation, gastric ulcer, poor appetite, unable to eat for 2 weeks prior admission.  Goal: Adequate nutrition will be maintained Outcome: Progressing, monitor CBG q ACHS, give adequate IV fluid infusion 5%dextrose- 0.9% NSS 75 ml/hr, adequate urine out put.   Problem: Clinical Measurements: EKG 12 leads is sinus brady cardia, 2nd degree AV block, HR 28-60 BPM. Goal: Cardiovascular complication will be avoided Outcome: Progressing: Pt is asymptomatic, no sign of any distress. Awake and alert. On call MD and Hospitalist NP made aware. Will continue to monitor.    Problem: Clinical Measurements: SPO2 drop to 70s% when HR drop, but unsustainable. Goal: Respiratory complications will improve, normal respiration. Outcome: Progressing: O2 NCL 3 LPM given to support O2 demand. Maintain SPO2 >92% and continuous monitoring.   Problem: Elimination: urine incontinent. Goal: Will not experience complications related to bowel motility and urination. Outcome: Progressing: External urine catheter in used.   Problem: Safety: legally blind both eyes, both ears hearing impairment, history of fall in the past 6 month prior admission, gait instability. Goal: Ability to remain free from injury will improve,  Outcome: Progressing: frequent verbal contact as necessary, reassurance, low bed , bed alarm activated and floor mat applied, call bell with in reach, encouraged family member supervision.   Filiberto Pinks, BSN, RN, [employer] [employer]

## 2018-06-08 NOTE — Progress Notes (Signed)
[facility] PROGRESS NOTE  Briana Martinez ZOX:[id number] DOB: 1921-11-04 DOA: 06/06/2018   PCP: Kendell Bane, MD     Assessment/Plan: Principal Problem:   Abdominal pain Active Problems:   Leukocytosis   Asymptomatic bacteriuria   Hyponatremia   Bradycardia   AVB (atrioventricular block)   85 y.o.femalewith medical history significant ofCVA and DM type II; who presents with complaints of 2 weeks of epigastric abdominal pain UGI series has shown Deep gastric ulcer/contained perforation along the posterior wall of the stomach,perforation appears to be contained.  Assessment and plan Epigastric abdominal pain 2/2 possible gastric diverticulum/contained perforation/peptic ulcer: Acute.  Patient presents with complaints of 2 weeks of epigastric abdominal pain.  CT scan fluid-filled gastric outpouching from the lesser curvature of the stomach.  General surgery consulted due to the possibility of a contained gastric perforation.likely not a surgical candidate Discussed with Dr.Mann, given contained perforation, she's not a candidate for EGD High likelihood of malignancy given recent weight loss of 18 pounds in the last 2 weeks Currently on PPI infusion, antibiotics - N.p.o.pending clearance from surgery, unless the patient chooses to become comfort care  - Antibiotics Zosyn IV - Normal saline IV fluids and 75 mL/h Hemoglobin stable, so doubt complete perforation According to surgery,Would continue NPO, PPI gtt and consider repeating upper GI this weekend to assess for healing  Bradycardia with atrial fibrillation/second-degree AV block Rapid response last night, patient noted to have second-degree heart block type 1, oxygen saturation of 80% on room air Noted while she was asleep. Immediate recovery upon awakening. Troponin negative 1. TSH normal. Electrolytes are okay. 2-D echo pending. Patient may have sick sinus syndrome paroxysmal atrial fibrillation and AV  BLOCK Palliative care consult requested, to discuss overall prognosis with the family  Leukocytosis WBC elevated at 13.  Suspect secondary to above. - Recheck CBC in a.m.  Atrial fibrillation: Patient appears to be in atrial fibrillation with heart rates controlled.  Not on anticoagulation.  Unclear previous history of this.CHA2DS2-VASc score = 7.   - Hold anticoagulation due to possible need of procedure  Asymptomatic bacteriuria: Urinalysis revealed moderate leukocytes, rare bacteria, and greater than 50 WBCs.  Patient denies complaints of dysuria or urinary frequency. - Follow-up and urine culture  Hyponatremia: likely related to dehydration,Acute. Initial sodium 128 on admission. now 138.   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 - Further work-up and/or evaluation may be warranted      DVT prophylaxsis SCDs  Code Status:  Currently a full code. Palliative care   Family Communication: Discussed in detail with the patient, all imaging results, lab results explained to the patient   Disposition Plan:  Not clinically stable for discharge     Consultants:  Palliative care  general surgery  Procedures:  none  Antibiotics: Anti-infectives (From admission, onward)   Start     Dose/Rate Route Frequency Ordered Stop   06/07/18 1400  piperacillin-tazobactam (ZOSYN) IVPB 3.375 g     3.375 g 12.5 mL/hr over 240 Minutes Intravenous Every 8 hours 06/07/18 0534     06/07/18 0400  piperacillin-tazobactam (ZOSYN) IVPB 3.375 g     3.375 g 12.5 mL/hr over 240 Minutes Intravenous  Once 06/07/18 0351 06/07/18 0836         HPI/Subjective: Patient requesting to drink something, unfortunately she is npo Denies any nausea vomiting abdominal  Pain, hematemesis  Objective: Vitals:   06/07/18 2300 06/07/18 2330 06/07/18 2332 06/08/18 0517  BP: (!) 149/41 (!) 117/45  (!) 136/44  Pulse:  64 (!) 33 (!) 33 (!) 59  Resp: 20 18 19 20   Temp:    97.8 F (36.6 C)  TempSrc:    Oral  SpO2: 98% 96% 98% 95%  Weight:        Intake/Output Summary (Last 24 hours) at 06/08/2018 0749 Last data filed at 06/08/2018 0524 Gross per 24 hour  Intake 1360.59 ml  Output 350 ml  Net 1010.59 ml    Exam:  Examination:  General exam: Appears calm and comfortable  Respiratory system: Clear to auscultation. Respiratory effort normal. Cardiovascular system: S1 & S2 heard, RRR. No JVD, murmurs, rubs, gallops or clicks. No pedal edema. Gastrointestinal system: Abdomen is nondistended, soft and nontender. No organomegaly or masses felt. Normal bowel sounds heard. Central nervous system: Alert and oriented. No focal neurological deficits. Extremities: Symmetric 5 x 5 power. Skin: No rashes, lesions or ulcers Psychiatry: Judgement and insight appear normal. Mood & affect appropriate.     Data Reviewed: I have personally reviewed following labs and imaging studies  Micro Results No results found for this or any previous visit (from the past 240 hour(s)).  Radiology Reports Ct Abdomen Pelvis W Contrast  Result Date: 06/07/2018 CLINICAL DATA:  85 year old with acute abdominal pain. Nausea and vomiting for weeks. EXAM: CT ABDOMEN AND PELVIS WITH CONTRAST TECHNIQUE: Multidetector CT imaging of the abdomen and pelvis was performed using the standard protocol following bolus administration of intravenous contrast. CONTRAST:  OMNIPAQUE IOHEXOL 300 MG/ML  SOLN COMPARISON:  Report from abdominal CT 03/31/2014 at an outside institution reviewed in Care Everywhere, images not available. FINDINGS: Lower chest: Ovoid paravertebral lesion at the right lung base is homogeneous attenuation with well-defined lobulated contours, extra vertebral component measuring 3.6 x 2.0 x 3.9 cm and extending into the right T11-T12 neural foramen with chronic osseous remottling. Component extends into the spinal canal. There  is associated atelectasis in the right lower lobe. Coronary artery calcifications. Calcified noncalcified atheromatous plaque involving the descending thoracic aorta. Hepatobiliary: Focal hepatic lesion. Gallbladder filled with stones. No pericholecystic inflammation. No biliary dilatation. Pancreas: No ductal dilatation or inflammation. Spleen: Normal in size without focal abnormality. Adrenals/Urinary Tract: Mild right adrenal thickening without dominant nodule. The left adrenal gland is normal. No hydronephrosis or perinephric edema. Homogeneous renal enhancement with symmetric excretion on delayed phase imaging. Small cyst in the upper left kidney. Urinary bladder is partially distended without wall thickening. Stomach/Bowel: Small hiatal hernia. There is a rounded outpouching of fluid from the lesser curvature of the stomach with the neck of 11 mm, image 23 series 3. Significant normal appendix. Moderate to large colonic stool burden. No colonic wall thickening or inflammatory change. Sigmoid diverticulosis without diverticulitis. Surrounding fat stranding and inflammatory change. Mild associated gastric wall thickening. The small bowel is decompressed without inflammatory change. Vascular/Lymphatic: Calcified noncalcified irregular plaque throughout the lower thoracic and abdominal aorta. No aneurysm. Mesenteric vessels appear patent. Portal vein is patent. There is prominent left adnexal vascularity and dilatation of the left ovarian vein to 6 mm. No adenopathy. Reproductive: Post hysterectomy. Prominent left adnexal vascularity with dilatation of the ovarian vein at 6 mm. No adnexal mass. Other: Small amount of free fluid in the pelvis. Suspect small amount of fluid in the lesser sac adjacent gastric inflammation. No free air or loculated abscess. Musculoskeletal: Multilevel degenerative change in the lumbar spine. Grade 1  anterolisthesis of L5 on S1 is likely facet mediated. Perivertebral mass in the lower  thorax as described above. IMPRESSION: 1. Fluid-filled gastric outpouching from the lesser curvature. There is significant surrounding inflammatory change. Findings are suspicious for early or contained gastric perforation, possible peptic ulcer disease, less likely gastric diverticulum which is inflamed. No extraluminal air. Recommend surgical consultation. 2. Chronic findings include gallstones and colonic diverticulosis without diverticulitis. Small hiatal hernia. 3. 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 This was described on outside prior exam. 4. Possible pelvic congestion. 5.  Aortic Atherosclerosis (ICD10-I70.0). These results were called by telephone at the time of interpretation on 06/07/2018 at 3:09 am to Dr. Geoffery LyonsUGLAS DELO , who verbally acknowledged these results. Electronically Signed   By: Narda RutherfordMelanie  Sanford M.D.   On: 06/07/2018 03:10   Dg Kayleen MemosUgi W/water Sol Cm  Result Date: 06/07/2018 CLINICAL DATA:  Epigastric pain EXAM: WATER SOLUBLE UPPER GI SERIES TECHNIQUE: Single-column upper GI series was performed using water soluble contrast. CONTRAST:  150mL OMNIPAQUE IOHEXOL 300 MG/ML  SOLN COMPARISON:  Abdominal CT from earlier the same day FLUOROSCOPY TIME:  Fluoroscopy Time:  2 minutes Radiation Exposure Index (if provided by the fluoroscopic device): 20.6 mGy Number of Acquired Spot Images: 6 FINDINGS: KUB shows a normal bowel gas pattern. There is excreting contrast in the collecting system from recent enhanced CT. Oblique pharyngeal imaging shows good oral coordination and airway protection. There a transient lateral pharyngeal outpouchings without stasis. The esophagus has normal distensibility and a smooth mucosal contour. There is a small transient sliding hiatal hernia. The stomach has normal shape and distensibility. A posterior wall ulcer is readily identified and measures up to 2.3 cm. No intraperitoneal or retroperitoneal leak is seen. No evident keeping at the margins,  although this is a single contrast water soluble study. No gastric outlet obstruction. Duodenal fold pattern is unremarkable. Spontaneous gastroesophageal reflux to the thoracic inlet. IMPRESSION: 1. Deep gastric ulcer/contained perforation along the posterior wall of the stomach. 2. Prominent gastroesophageal reflux. Electronically Signed   By: Marnee SpringJonathon  Watts M.D.   On: 06/07/2018 08:01     CBC Recent Labs  Lab 06/06/18 2356 06/08/18 0331  WBC 13.2* 6.7  HGB 10.4* 9.0*  HCT 32.0* 28.8*  PLT 438* 342  MCV 92.8 95.7  MCH 30.1 29.9  MCHC 32.5 31.3  RDW 12.6 12.9    Chemistries  Recent Labs  Lab 06/06/18 2356 06/08/18 0331  NA 128* 138  K 4.1 3.6  CL 91* 105  CO2 25 25  GLUCOSE 208* 103*  BUN 18 11  CREATININE 0.84 1.03*  CALCIUM 9.8 8.6*  AST 14*  --   ALT 14  --   ALKPHOS 53  --   BILITOT 0.5  --    ------------------------------------------------------------------------------------------------------------------ CrCl cannot be calculated (Unknown ideal weight.). ------------------------------------------------------------------------------------------------------------------ No results for input(s): HGBA1C in the last 72 hours. ------------------------------------------------------------------------------------------------------------------ No results for input(s): CHOL, HDL, LDLCALC, TRIG, CHOLHDL, LDLDIRECT in the last 72 hours. ------------------------------------------------------------------------------------------------------------------ Recent Labs    06/07/18 1513  TSH 1.687   ------------------------------------------------------------------------------------------------------------------ No results for input(s): VITAMINB12, FOLATE, FERRITIN, TIBC, IRON, RETICCTPCT in the last 72 hours.  Coagulation profile No results for input(s): INR, PROTIME in the last 168 hours.  No results for input(s): DDIMER in the last 72 hours.  Cardiac Enzymes Recent Labs   Lab 06/07/18 1513  TROPONINI <0.03   ------------------------------------------------------------------------------------------------------------------ Invalid input(s): POCBNP   CBG: Recent Labs  Lab 06/07/18 2109 06/08/18 0603  GLUCAP  77 106*       Studies: Ct Abdomen Pelvis W Contrast  Result Date: 06/07/2018 CLINICAL DATA:  85 year old with acute abdominal pain. Nausea and vomiting for weeks. EXAM: CT ABDOMEN AND PELVIS WITH CONTRAST TECHNIQUE: Multidetector CT imaging of the abdomen and pelvis was performed using the standard protocol following bolus administration of intravenous contrast. CONTRAST:  OMNIPAQUE IOHEXOL 300 MG/ML  SOLN COMPARISON:  Report from abdominal CT 03/31/2014 at an outside institution reviewed in Care Everywhere, images not available. FINDINGS: Lower chest: Ovoid paravertebral lesion at the right lung base is homogeneous attenuation with well-defined lobulated contours, extra vertebral component measuring 3.6 x 2.0 x 3.9 cm and extending into the right T11-T12 neural foramen with chronic osseous remottling. Component extends into the spinal canal. There is associated atelectasis in the right lower lobe. Coronary artery calcifications. Calcified noncalcified atheromatous plaque involving the descending thoracic aorta. Hepatobiliary: Focal hepatic lesion. Gallbladder filled with stones. No pericholecystic inflammation. No biliary dilatation. Pancreas: No ductal dilatation or inflammation. Spleen: Normal in size without focal abnormality. Adrenals/Urinary Tract: Mild right adrenal thickening without dominant nodule. The left adrenal gland is normal. No hydronephrosis or perinephric edema. Homogeneous renal enhancement with symmetric excretion on delayed phase imaging. Small cyst in the upper left kidney. Urinary bladder is partially distended without wall thickening. Stomach/Bowel: Small hiatal hernia. There is a rounded outpouching of fluid from the lesser  curvature of the stomach with the neck of 11 mm, image 23 series 3. Significant normal appendix. Moderate to large colonic stool burden. No colonic wall thickening or inflammatory change. Sigmoid diverticulosis without diverticulitis. Surrounding fat stranding and inflammatory change. Mild associated gastric wall thickening. The small bowel is decompressed without inflammatory change. Vascular/Lymphatic: Calcified noncalcified irregular plaque throughout the lower thoracic and abdominal aorta. No aneurysm. Mesenteric vessels appear patent. Portal vein is patent. There is prominent left adnexal vascularity and dilatation of the left ovarian vein to 6 mm. No adenopathy. Reproductive: Post hysterectomy. Prominent left adnexal vascularity with dilatation of the ovarian vein at 6 mm. No adnexal mass. Other: Small amount of free fluid in the pelvis. Suspect small amount of fluid in the lesser sac adjacent gastric inflammation. No free air or loculated abscess. Musculoskeletal: Multilevel degenerative change in the lumbar spine. Grade 1 anterolisthesis of L5 on S1 is likely facet mediated. Perivertebral mass in the lower thorax as described above. IMPRESSION: 1. Fluid-filled gastric outpouching from the lesser curvature. There is significant surrounding inflammatory change. Findings are suspicious for early or contained gastric perforation, possible peptic ulcer disease, less likely gastric diverticulum which is inflamed. No extraluminal air. Recommend surgical consultation. 2. Chronic findings include gallstones and colonic diverticulosis without diverticulitis. Small hiatal hernia. 3. 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 This was described on outside prior exam. 4. Possible pelvic congestion. 5.  Aortic Atherosclerosis (ICD10-I70.0). These results were called by telephone at the time of interpretation on 06/07/2018 at 3:09 am to Dr. Geoffery Lyons , who verbally acknowledged these results.  Electronically Signed   By: Narda Rutherford M.D.   On: 06/07/2018 03:10   Dg Kayleen Memos W/water Sol Cm  Result Date: 06/07/2018 CLINICAL DATA:  Epigastric pain EXAM: WATER SOLUBLE UPPER GI SERIES TECHNIQUE: Single-column upper GI series was performed using water soluble contrast. CONTRAST:  OMNIPAQUE IOHEXOL 300 MG/ML  SOLN COMPARISON:  Abdominal CT from earlier the same day FLUOROSCOPY TIME:  Fluoroscopy Time:  2 minutes Radiation Exposure Index (if provided by the fluoroscopic device): 20.6 mGy  Number of Acquired Spot Images: 6 FINDINGS: KUB shows a normal bowel gas pattern. There is excreting contrast in the collecting system from recent enhanced CT. Oblique pharyngeal imaging shows good oral coordination and airway protection. There a transient lateral pharyngeal outpouchings without stasis. The esophagus has normal distensibility and a smooth mucosal contour. There is a small transient sliding hiatal hernia. The stomach has normal shape and distensibility. A posterior wall ulcer is readily identified and measures up to 2.3 cm. No intraperitoneal or retroperitoneal leak is seen. No evident keeping at the margins, although this is a single contrast water soluble study. No gastric outlet obstruction. Duodenal fold pattern is unremarkable. Spontaneous gastroesophageal reflux to the thoracic inlet. IMPRESSION: 1. Deep gastric ulcer/contained perforation along the posterior wall of the stomach. 2. Prominent gastroesophageal reflux. Electronically Signed   By: Marnee Spring M.D.   On: 06/07/2018 08:01      Lab Results  Component Value Date   HGBA1C (H) 10/26/2009    6.1 (NOTE)                                                                       According to the ADA Clinical Practice Recommendations for 2011, when HbA1c is used as a screening test:   >=6.5%   Diagnostic of Diabetes Mellitus           (if abnormal result  is confirmed)  5.7-6.4%   Increased risk of developing Diabetes Mellitus   References:Diagnosis and Classification of Diabetes Mellitus,Diabetes Care,2011,34(Suppl 1):S62-S69 and Standards of Medical Care in         Diabetes - 2011,Diabetes Care,2011,34  (Suppl 1):S11-S61.   HGBA1C (H) 10/23/2009    6.1 (NOTE)                                                                       According to the ADA Clinical Practice Recommendations for 2011, when HbA1c is used as a screening test:   >=6.5%   Diagnostic of Diabetes Mellitus           (if abnormal result  is confirmed)  5.7-6.4%   Increased risk of developing Diabetes Mellitus  References:Diagnosis and Classification of Diabetes Mellitus,Diabetes Care,2011,34(Suppl 1):S62-S69 and Standards of Medical Care in         Diabetes - 2011,Diabetes Care,2011,34  (Suppl 1):S11-S61.   Lab Results  Component Value Date   LDLCALC (H) 10/26/2009    101        Total Cholesterol/HDL:CHD Risk Coronary Heart Disease Risk Table                     Men   Women  1/2 Average Risk   3.4   3.3  Average Risk       5.0   4.4  2 X Average Risk   9.6   7.1  3 X Average Risk  23.4   11.0        Use the calculated Patient Ratio above  and the CHD Risk Table to determine the patient's CHD Risk.        ATP III CLASSIFICATION (LDL):  <100     mg/dL   Optimal  [id number]  mg/dL   Near or Above                    Optimal  130-159  mg/dL   Borderline  [id number]  mg/dL   High  >811>190     mg/dL   Very High   CREATININE 1.03 (H) 06/08/2018       Scheduled Meds: . [START ON 06/10/2018] pantoprazole  40 mg Intravenous Q12H   Continuous Infusions: . dextrose 5 % and 0.9% NaCl 75 mL/hr at 06/07/18 2328  . pantoprozole (PROTONIX) infusion 8 mg/hr (06/07/18 2348)  . piperacillin-tazobactam (ZOSYN)  IV 3.375 g (06/08/18 0524)     LOS: 1 day    Time spent: >30 MINS    Richarda OverlieNayana Sonika Levins  [facility] Pager [phone]. If 7PM-7AM, please contact night-coverage at [email or web address], password [email or web address] 06/08/2018, 7:49 AM  LOS: 1 day

## 2018-06-08 NOTE — Progress Notes (Signed)
  Echocardiogram 2D Echocardiogram has been performed.  Kanna Dafoe L Androw 06/08/2018, 9:48 AM

## 2018-06-08 NOTE — Progress Notes (Signed)
[facility] Progress Note     Subjective: CC-  No complaints this morning. Denies any abdominal pain, nausea, or vomiting. No flatus or BM. Rapid response called last night due to bradycardia with atrial fibrillation/second-degree AV block. Patient seems asymptomatic, denies dizziness or chest pain. ECHO pending.  Objective: Vital signs in last 24 hours: Temp:  [97.8 F (36.6 C)-98.2 F (36.8 C)] 98.2 F (36.8 C) (01/07 0730) Pulse Rate:  [25-108] 64 (01/07 0800) Resp:  [12-28] 17 (01/07 0800) BP: (96-152)/(30-120) 142/60 (01/07 0800) SpO2:  [77 %-100 %] 100 % (01/07 0800) FiO2 (%):  [3 %] 3 % (01/07 0517) Weight:  [63.7 kg] 63.7 kg (01/06 2000) Last BM Date: 06/03/18  Intake/Output from previous day: 01/06 0701 - 01/07 0700 In: 1360.6 [I.V.:1209.1; IV Piggyback:151.5] Out: 350 [Urine:350] Intake/Output this shift: No intake/output data recorded.  PE: Gen:  Alert, NAD, pleasant HEENT: EOM's intact, pupils equal and round Card:  Irregular, HR ~60bpm while I was in the room Pulm:  effort normal Abd: Soft, ND, +BS, no HSM, NT, no rebound or guarding Psych: A&Ox3  Skin: no rashes noted, warm and dry  Lab Results:  Recent Labs    06/06/18 2356 06/08/18 0331  WBC 13.2* 6.7  HGB 10.4* 9.0*  HCT 32.0* 28.8*  PLT 438* 342   BMET Recent Labs    06/06/18 2356 06/08/18 0331  NA 128* 138  K 4.1 3.6  CL 91* 105  CO2 25 25  GLUCOSE 208* 103*  BUN 18 11  CREATININE 0.84 1.03*  CALCIUM 9.8 8.6*   PT/INR No results for input(s): LABPROT, INR in the last 72 hours. CMP     Component Value Date/Time   NA 138 06/08/2018 0331   K 3.6 06/08/2018 0331   CL 105 06/08/2018 0331   CO2 25 06/08/2018 0331   GLUCOSE 103 (H) 06/08/2018 0331   BUN 11 06/08/2018 0331   CREATININE 1.03 (H) 06/08/2018 0331   CALCIUM 8.6 (L) 06/08/2018 0331   PROT 7.4 06/06/2018 2356   ALBUMIN 3.0 (L) 06/06/2018 2356   AST 14 (L) 06/06/2018 2356   ALT 14 06/06/2018 2356    ALKPHOS 53 06/06/2018 2356   BILITOT 0.5 06/06/2018 2356   GFRNONAA 46 (L) 06/08/2018 0331   GFRAA 54 (L) 06/08/2018 0331   Lipase     Component Value Date/Time   LIPASE 102 (H) 06/06/2018 2356       Studies/Results: Ct Abdomen Pelvis W Contrast  Result Date: 06/07/2018 CLINICAL DATA:  85 year old with acute abdominal pain. Nausea and vomiting for weeks. EXAM: CT ABDOMEN AND PELVIS WITH CONTRAST TECHNIQUE: Multidetector CT imaging of the abdomen and pelvis was performed using the standard protocol following bolus administration of intravenous contrast. CONTRAST:  100mL OMNIPAQUE IOHEXOL 300 MG/ML  SOLN COMPARISON:  Report from abdominal CT 03/31/2014 at an outside institution reviewed in Care Everywhere, images not available. FINDINGS: Lower chest: Ovoid paravertebral lesion at the right lung base is homogeneous attenuation with well-defined lobulated contours, extra vertebral component measuring 3.6 x 2.0 x 3.9 cm and extending into the right T11-T12 neural foramen with chronic osseous remottling. Component extends into the spinal canal. There is associated atelectasis in the right lower lobe. Coronary artery calcifications. Calcified noncalcified atheromatous plaque involving the descending thoracic aorta. Hepatobiliary: Focal hepatic lesion. Gallbladder filled with stones. No pericholecystic inflammation. No biliary dilatation. Pancreas: No ductal dilatation or inflammation. Spleen: Normal in size without focal abnormality. Adrenals/Urinary Tract: Mild right adrenal thickening without dominant nodule. The  left adrenal gland is normal. No hydronephrosis or perinephric edema. Homogeneous renal enhancement with symmetric excretion on delayed phase imaging. Small cyst in the upper left kidney. Urinary bladder is partially distended without wall thickening. Stomach/Bowel: Small hiatal hernia. There is a rounded outpouching of fluid from the lesser curvature of the stomach with the neck of 11 mm, image  23 series 3. Significant normal appendix. Moderate to large colonic stool burden. No colonic wall thickening or inflammatory change. Sigmoid diverticulosis without diverticulitis. Surrounding fat stranding and inflammatory change. Mild associated gastric wall thickening. The small bowel is decompressed without inflammatory change. Vascular/Lymphatic: Calcified noncalcified irregular plaque throughout the lower thoracic and abdominal aorta. No aneurysm. Mesenteric vessels appear patent. Portal vein is patent. There is prominent left adnexal vascularity and dilatation of the left ovarian vein to 6 mm. No adenopathy. Reproductive: Post hysterectomy. Prominent left adnexal vascularity with dilatation of the ovarian vein at 6 mm. No adnexal mass. Other: Small amount of free fluid in the pelvis. Suspect small amount of fluid in the lesser sac adjacent gastric inflammation. No free air or loculated abscess. Musculoskeletal: Multilevel degenerative change in the lumbar spine. Grade 1 anterolisthesis of L5 on S1 is likely facet mediated. Perivertebral mass in the lower thorax as described above. IMPRESSION: 1. Fluid-filled gastric outpouching from the lesser curvature. There is significant surrounding inflammatory change. Findings are suspicious for early or contained gastric perforation, possible peptic ulcer disease, less likely gastric diverticulum which is inflamed. No extraluminal air. Recommend surgical consultation. 2. Chronic findings include gallstones and colonic diverticulosis without diverticulitis. Small hiatal hernia. 3. 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 This was described on outside prior exam. 4. Possible pelvic congestion. 5.  Aortic Atherosclerosis (ICD10-I70.0). These results were called by telephone at the time of interpretation on 06/07/2018 at 3:09 am to Dr. Geoffery LyonsUGLAS DELO , who verbally acknowledged these results. Electronically Signed   By: Narda RutherfordMelanie  Sanford M.D.   On:  06/07/2018 03:10   Dg Kayleen MemosUgi W/water Sol Cm  Result Date: 06/07/2018 CLINICAL DATA:  Epigastric pain EXAM: WATER SOLUBLE UPPER GI SERIES TECHNIQUE: Single-column upper GI series was performed using water soluble contrast. CONTRAST:  150mL OMNIPAQUE IOHEXOL 300 MG/ML  SOLN COMPARISON:  Abdominal CT from earlier the same day FLUOROSCOPY TIME:  Fluoroscopy Time:  2 minutes Radiation Exposure Index (if provided by the fluoroscopic device): 20.6 mGy Number of Acquired Spot Images: 6 FINDINGS: KUB shows a normal bowel gas pattern. There is excreting contrast in the collecting system from recent enhanced CT. Oblique pharyngeal imaging shows good oral coordination and airway protection. There a transient lateral pharyngeal outpouchings without stasis. The esophagus has normal distensibility and a smooth mucosal contour. There is a small transient sliding hiatal hernia. The stomach has normal shape and distensibility. A posterior wall ulcer is readily identified and measures up to 2.3 cm. No intraperitoneal or retroperitoneal leak is seen. No evident keeping at the margins, although this is a single contrast water soluble study. No gastric outlet obstruction. Duodenal fold pattern is unremarkable. Spontaneous gastroesophageal reflux to the thoracic inlet. IMPRESSION: 1. Deep gastric ulcer/contained perforation along the posterior wall of the stomach. 2. Prominent gastroesophageal reflux. Electronically Signed   By: Marnee SpringJonathon  Watts M.D.   On: 06/07/2018 08:01    Anti-infectives: Anti-infectives (From admission, onward)   Start     Dose/Rate Route Frequency Ordered Stop   06/07/18 1400  piperacillin-tazobactam (ZOSYN) IVPB 3.375 g     3.375 g 12.5 mL/hr over  240 Minutes Intravenous Every 8 hours 06/07/18 0534     06/07/18 0400  piperacillin-tazobactam (ZOSYN) IVPB 3.375 g     3.375 g 12.5 mL/hr over 240 Minutes Intravenous  Once 06/07/18 0351 06/07/18 0836       Assessment/Plan Hypertension Diabetes  mellitus History of CVA Atrial fibrillation Bradycardia with atrial fibrillation/second-degree AV block - ECHO pending  Deep gastric ulcer/contained perforation along the posterior wall of the stomach - high likelihood of malignancy given recent weight loss - per GI she's not a candidate for EGD due to contained perf - continue antibiotics, PPI infusion, and bowel rest  ID - zosyn 1/6>> FEN - IVF, NPO VTE - SCDs Foley - wick Follow up - TBD  Plan - Continue current plan of care as above with abx, PPI infusion, and NPO. Palliative consult pending for goals of care.    LOS: 1 day    Franne Forts , [facility] 06/08/2018, 9:18 AM Pager: [phone] Mon 7:00 am -11:30 AM Tues-Fri 7:00 am-4:30 pm Sat-Sun 7:00 am-11:30 am

## 2018-06-08 NOTE — Plan of Care (Signed)

## 2018-06-09 ENCOUNTER — Encounter (HOSPITAL_COMMUNITY): Payer: Self-pay | Admitting: Primary Care

## 2018-06-09 ENCOUNTER — Inpatient Hospital Stay (HOSPITAL_COMMUNITY): Payer: Medicare Other

## 2018-06-09 DIAGNOSIS — D72829 Elevated white blood cell count, unspecified: Secondary | ICD-10-CM

## 2018-06-09 DIAGNOSIS — E876 Hypokalemia: Secondary | ICD-10-CM

## 2018-06-09 DIAGNOSIS — E871 Hypo-osmolality and hyponatremia: Secondary | ICD-10-CM

## 2018-06-09 DIAGNOSIS — K3189 Other diseases of stomach and duodenum: Secondary | ICD-10-CM

## 2018-06-09 DIAGNOSIS — R8271 Bacteriuria: Secondary | ICD-10-CM

## 2018-06-09 DIAGNOSIS — R1010 Upper abdominal pain, unspecified: Secondary | ICD-10-CM

## 2018-06-09 DIAGNOSIS — Z515 Encounter for palliative care: Secondary | ICD-10-CM

## 2018-06-09 DIAGNOSIS — I4430 Unspecified atrioventricular block: Secondary | ICD-10-CM

## 2018-06-09 DIAGNOSIS — Z7189 Other specified counseling: Secondary | ICD-10-CM

## 2018-06-09 LAB — COMPREHENSIVE METABOLIC PANEL
ALT: 17 U/L (ref 0–44)
AST: 21 U/L (ref 15–41)
Albumin: 2.1 g/dL — ABNORMAL LOW (ref 3.5–5.0)
Alkaline Phosphatase: 45 U/L (ref 38–126)
Anion gap: 6 (ref 5–15)
BUN: 8 mg/dL (ref 8–23)
CHLORIDE: 108 mmol/L (ref 98–111)
CO2: 24 mmol/L (ref 22–32)
Calcium: 8.3 mg/dL — ABNORMAL LOW (ref 8.9–10.3)
Creatinine, Ser: 0.99 mg/dL (ref 0.44–1.00)
GFR calc Af Amer: 57 mL/min — ABNORMAL LOW (ref >60)
GFR calc non Af Amer: 49 mL/min — ABNORMAL LOW (ref >60)
Glucose, Bld: 151 mg/dL — ABNORMAL HIGH (ref 70–99)
POTASSIUM: 3.2 mmol/L — AB (ref 3.5–5.1)
Sodium: 138 mmol/L (ref 135–145)
Total Bilirubin: 0.2 mg/dL — ABNORMAL LOW (ref 0.3–1.2)
Total Protein: 5.7 g/dL — ABNORMAL LOW (ref 6.5–8.1)

## 2018-06-09 LAB — URINE CULTURE: Culture: >=100000 — AB

## 2018-06-09 LAB — CBC
HCT: 30.5 % — ABNORMAL LOW (ref 36.0–46.0)
Hemoglobin: 9.3 g/dL — ABNORMAL LOW (ref 12.0–15.0)
MCH: 29.7 pg (ref 26.0–34.0)
MCHC: 30.5 g/dL (ref 30.0–36.0)
MCV: 97.4 fL (ref 80.0–100.0)
PLATELETS: 321 10*3/uL (ref 150–400)
RBC: 3.13 MIL/uL — AB (ref 3.87–5.11)
RDW: 12.9 % (ref 11.5–15.5)
WBC: 7.7 10*3/uL (ref 4.0–10.5)
nRBC: 0 % (ref 0.0–0.2)

## 2018-06-09 LAB — GLUCOSE, CAPILLARY
GLUCOSE-CAPILLARY: 95 mg/dL (ref 70–99)
GLUCOSE-CAPILLARY: 112 mg/dL — AB (ref 70–99)
Glucose-Capillary: 120 mg/dL — ABNORMAL HIGH (ref 70–99)
Glucose-Capillary: 117 mg/dL — ABNORMAL HIGH (ref 70–99)

## 2018-06-09 LAB — MAGNESIUM: Magnesium: 1.8 mg/dL (ref 1.7–2.4)

## 2018-06-09 MED ORDER — METOPROLOL TARTRATE 5 MG/5ML IV SOLN
2.5000 mg | Freq: Four times a day (QID) | INTRAVENOUS | Status: DC | PRN
  Filled 2018-06-09: qty 5

## 2018-06-09 MED ORDER — HYDRALAZINE HCL 20 MG/ML IJ SOLN
10.0000 mg | Freq: Four times a day (QID) | INTRAMUSCULAR | Status: DC | PRN
  Administered 2018-06-09 – 2018-06-11 (×2): 10 mg via INTRAVENOUS
  Filled 2018-06-09 (×2): qty 1

## 2018-06-09 MED ORDER — POTASSIUM CHLORIDE 10 MEQ/100ML IV SOLN
10.0000 meq | INTRAVENOUS | Status: AC
  Administered 2018-06-09 (×4): 10 meq via INTRAVENOUS
  Filled 2018-06-09 (×3): qty 100

## 2018-06-09 NOTE — Evaluation (Signed)
Physical Therapy Evaluation Patient Details Name: Briana Martinez MRN: [id number] DOB: June 11, 1921 Today's Date: 06/09/2018   History of Present Illness  85yo female c/o epigastric pain, malaise and weakness, CT concerning for possible perforation, diverticulum, or peptic ulcer. PMH DM, CVA, joint replacement, HOH   Clinical Impression   Patient received in bed, very pleasant and willing to work with PT; daughter present and observed part of session. Able to complete bed mobility with min guard for safety, functional transfers initially with light MinA fading to min guard and VC for safety, and gait approximately 76f in room with RW and min guard. Did not progress gait distance today due to knee pain. SpO2 and HR remained WNL with activity on room air. Daughter reports that they will be able to arrange to have someone staying with her after discharge from the hospital. She was left up in the chair with chair alarm active and family present, all questions/concerns addressed and needs met this morning. She will continue to benefit from skilled PT services in the acute setting, also recommend skilled HHPT services and family supervision 24/7 moving forward.     Follow Up Recommendations Home health PT    Equipment Recommendations  3in1 (PT)    Recommendations for Other Services       Precautions / Restrictions Precautions Precautions: Fall Restrictions Weight Bearing Restrictions: No      Mobility  Bed Mobility Overal bed mobility: Needs Assistance Bed Mobility: Supine to Sit     Supine to sit: Min guard     General bed mobility comments: Min guard for safety, no other physical assist given   Transfers Overall transfer level: Needs assistance Equipment used: Rolling walker (2 wheeled) Transfers: Sit to/from Stand Sit to Stand: Min guard         General transfer comment: Min guard to light MinA to power up to full standing position on first attempt; on second attempt able to  perform with min guard and cues   Ambulation/Gait Ambulation/Gait assistance: Min guard Gait Distance (Feet): 40 Feet Assistive device: Rolling walker (2 wheeled) Gait Pattern/deviations: Step-through pattern;Decreased step length - right;Decreased step length - left;Trunk flexed;Drifts right/left;Wide base of support Gait velocity: decreased    General Gait Details: able to tolerate gait in room, did not progress gait distance further due to knee pain, steady and safe with RW   Stairs            Wheelchair Mobility    Modified Rankin (Stroke Patients Only)       Balance Overall balance assessment: Needs assistance;History of Falls Sitting-balance support: Bilateral upper extremity supported;Feet supported Sitting balance-Leahy Scale: Good Sitting balance - Comments: able to help don shoes at EOB    Standing balance support: Bilateral upper extremity supported;During functional activity Standing balance-Leahy Scale: Fair Standing balance comment: heavy reliance on B UE support, min guard for safety                              Pertinent Vitals/Pain Pain Assessment: Faces Faces Pain Scale: Hurts a little bit Pain Location: R knee  Pain Descriptors / Indicators: Aching;Sore Pain Intervention(s): Limited activity within patient's tolerance;Monitored during session;Repositioned    Home Living Family/patient expects to be discharged to:: Private residence Living Arrangements: Alone Available Help at Discharge: Family;Available PRN/intermittently Type of Home: House Home Access: Ramped entrance     Home Layout: One level Home Equipment: Walker - 2 wheels;Cane - single  point Additional Comments: also has a service that comes and cleans for her once a week, she is able to sponge bathe and cook her own meals. No room to use RW in house so she uses cane inside/RW outside     Prior Function Level of Independence: Independent with assistive device(s)                Hand Dominance        Extremity/Trunk Assessment   Upper Extremity Assessment Upper Extremity Assessment: Defer to OT evaluation    Lower Extremity Assessment Lower Extremity Assessment: Generalized weakness    Cervical / Trunk Assessment Cervical / Trunk Assessment: Kyphotic  Communication   Communication: HOH  Cognition Arousal/Alertness: Awake/alert Behavior During Therapy: WFL for tasks assessed/performed Overall Cognitive Status: Within Functional Limits for tasks assessed                                        General Comments      Exercises     Assessment/Plan    PT Assessment Patient needs continued PT services  PT Problem List Decreased strength;Decreased balance;Decreased knowledge of precautions;Decreased mobility;Decreased knowledge of use of DME;Decreased activity tolerance;Decreased coordination;Decreased safety awareness       PT Treatment Interventions DME instruction;Functional mobility training;Balance training;Patient/family education;Gait training;Therapeutic activities;Neuromuscular re-education;Stair training;Therapeutic exercise    PT Goals (Current goals can be found in the Care Plan section)  Acute Rehab PT Goals Patient Stated Goal: go home PT Goal Formulation: With patient/family Time For Goal Achievement: 06/23/18 Potential to Achieve Goals: Good    Frequency Min 3X/week   Barriers to discharge        Co-evaluation               AM-PAC PT "6 Clicks" Mobility  Outcome Measure Help needed turning from your back to your side while in a flat bed without using bedrails?: A Little Help needed moving from lying on your back to sitting on the side of a flat bed without using bedrails?: A Little Help needed moving to and from a bed to a chair (including a wheelchair)?: A Little Help needed standing up from a chair using your arms (e.g., wheelchair or bedside chair)?: A Little Help needed to walk in  hospital room?: A Little Help needed climbing 3-5 steps with a railing? : A Lot 6 Click Score: 17    End of Session Equipment Utilized During Treatment: Gait belt Activity Tolerance: Patient tolerated treatment well Patient left: in chair;with chair alarm set;with call bell/phone within reach;with family/visitor present   PT Visit Diagnosis: Unsteadiness on feet (R26.81);Muscle weakness (generalized) (M62.81);History of falling (Z91.81)    Time: 2956-2130 PT Time Calculation (min) (ACUTE ONLY): 28 min   Charges:   PT Evaluation $PT Eval Low Complexity: 1 Low PT Treatments $Gait Training: 8-22 mins        Deniece Ree PT, DPT, CBIS  Supplemental Physical Therapist [place]    Pager [phone] Acute Rehab Office [phone]

## 2018-06-09 NOTE — Progress Notes (Addendum)
[facility] Progress Note     Subjective: CC-  Daughter at bedside. Patient denies any abdominal pain, nausea, or vomiting. WBC WNL, afebrile.  Main complaint this morning is right knee pain. States that it has bothered her intermittently for several months, but is worse since being in the hospital/in the bed. States that it has caused her to fall multiple times at home.  Objective: Vital signs in last 24 hours: Temp:  [97.9 F (36.6 C)-98.4 F (36.9 C)] 97.9 F (36.6 C) (01/08 0821) Pulse Rate:  [53-66] 59 (01/08 0821) Resp:  [11-21] 13 (01/08 0821) BP: (126-160)/(46-101) 144/66 (01/08 0821) SpO2:  [96 %-100 %] 99 % (01/08 0821) Last BM Date: 06/03/18  Intake/Output from previous day: 01/07 0701 - 01/08 0700 In: 3077.6 [I.V.:2879.1; IV Piggyback:198.5] Out: 1050 [Urine:1050] Intake/Output this shift: No intake/output data recorded.  PE: Gen:  Alert, NAD, pleasant HEENT: EOM's intact, pupils equal and round Card:  Irregular, HR ~60bpm while I was in the room Pulm:  effort normal Abd: Soft, ND, +BS, no HSM, NT, no rebound or guarding Skin: no rashes noted, warm and dry   Lab Results:  Recent Labs    06/08/18 0331 06/09/18 0511  WBC 6.7 7.7  HGB 9.0* 9.3*  HCT 28.8* 30.5*  PLT 342 321   BMET Recent Labs    06/08/18 0331 06/09/18 0511  NA 138 138  K 3.6 3.2*  CL 105 108  CO2 25 24  GLUCOSE 103* 151*  BUN 11 8  CREATININE 1.03* 0.99  CALCIUM 8.6* 8.3*   PT/INR No results for input(s): LABPROT, INR in the last 72 hours. CMP     Component Value Date/Time   NA 138 06/09/2018 0511   K 3.2 (L) 06/09/2018 0511   CL 108 06/09/2018 0511   CO2 24 06/09/2018 0511   GLUCOSE 151 (H) 06/09/2018 0511   BUN 8 06/09/2018 0511   CREATININE 0.99 06/09/2018 0511   CALCIUM 8.3 (L) 06/09/2018 0511   PROT 5.7 (L) 06/09/2018 0511   ALBUMIN 2.1 (L) 06/09/2018 0511   AST 21 06/09/2018 0511   ALT 17 06/09/2018 0511   ALKPHOS 45 06/09/2018 0511   BILITOT  0.2 (L) 06/09/2018 0511   GFRNONAA 49 (L) 06/09/2018 0511   GFRAA 57 (L) 06/09/2018 0511   Lipase     Component Value Date/Time   LIPASE 102 (H) 06/06/2018 2356       Studies/Results: No results found.  Anti-infectives: Anti-infectives (From admission, onward)   Start     Dose/Rate Route Frequency Ordered Stop   06/07/18 1400  piperacillin-tazobactam (ZOSYN) IVPB 3.375 g     3.375 g 12.5 mL/hr over 240 Minutes Intravenous Every 8 hours 06/07/18 0534     06/07/18 0400  piperacillin-tazobactam (ZOSYN) IVPB 3.375 g     3.375 g 12.5 mL/hr over 240 Minutes Intravenous  Once 06/07/18 0351 06/07/18 0836       Assessment/Plan Hypertension Diabetes mellitus History of CVA Atrial fibrillation Bradycardia with atrial fibrillation/second-degree AV block - ECHO 1/7 EF 60-65% Right knee pain - check xray  Deep gastric ulcer/contained perforation along the posterior wall of the stomach - UGI 1/6 - high likelihood of malignancy given recent weight loss - per GI she's not a candidate for EGD due to contained perf - continue antibiotics, PPI infusion, and bowel rest  ID - zosyn 1/6>> FEN - IVF, NPO VTE - SCDs Foley - wick Follow up - TBD  Plan - Palliative consult still pending for  goals of care discussion, spoke with primary regarding this. Depending on GOC may need to consider starting TPN. From abdominal standpoint continue abx, PPI infusion, and NPO; plan for repeat UGI in the next few days to reevaluate gastric perforation. Will ask PT/OT to see. Check right knee xray.   LOS: 2 days    Franne Forts , [facility] 06/09/2018, 8:58 AM Pager: [phone] Mon 7:00 am -11:30 AM Tues-Fri 7:00 am-4:30 pm Sat-Sun 7:00 am-11:30 am

## 2018-06-09 NOTE — Progress Notes (Signed)
PROGRESS NOTE    Briana Martinez   XTG:[id number]  DOB: July 31, 1921  DOA: 06/06/2018 PCP: Kendell Bane, MD   Brief Narrative:  Briana Martinez 85 y.o.femalewith medical history significant ofCVA, and DM type II; who presents with complaints of 2 weeks of epigastric abdominal pain, intermittent vomiting and loss of appetite along with weight loss. Further imaging shows a deep gastric ulcer with a contained perforation along the posterior wall of the stomach.  Gen surgery consulted.    Subjective: She has no abdominal pain, nausea or vomiting. No other symptoms.  Assessment & Plan:   Principal Problem:   Gastric ulcer with contained perforation - see UGI and Ct abdomen results below -  management per gen surgery- no urgent need for OR - GI does not recommend EGD at this time - on PPI, bowel rest and Zosyn- cont IVF - Gen surgery plans for a repeat upper GI in a few days  Active Problems:   Leukocytosis - WBC 13.2- - ? Due to above- has normalized  Hypokalemia/ hyponatremia due to dehydration - replacing via IV    Asymptomatic bacteriuria  H/o HTN -on Amlodipine and Cozaar at home- BP slightly elevated- avoid oral meds in setting of perforation- PRN Lopressor ordered in case BP quite high  H/o CVA - on ASA 325 and Pravastatin as outpt  A-fib, 2nd degree type 1 AVB - 1 tele strip showing A-fib - noted to have bradycardia on the night of 1/6 with 2nd degree type 1 AVB while asleep (see strip in Epic) which resolved when she awakened - 2 D eCHO noted below did not show any significant abnormality - no further work up for now- await dicussion with palliative case   Time spent in minutes: 45 min- reviewed prior chart, discussion with surgery DVT prophylaxis: SCDs Code Status: Full code Family Communication:  Disposition Plan: per gen surgery- f/u on palliative care eval Consultants:   gen surgery  GI Procedures:  2 D ECHO Study Conclusions  - Left ventricle:  The cavity size was normal. Wall thickness was   increased in a pattern of mild LVH. Systolic function was normal.   The estimated ejection fraction was in the range of 60% to 65%.   Wall motion was normal; there were no regional wall motion   abnormalities. Left ventricular diastolic function parameters   were normal. - Aortic valve: Trileaflet; mildly thickened, mildly calcified   leaflets. Valve area (VTI): 1.42 cm^2. Valve area (Vmax): 1.56   cm^2. Valve area (Vmean): 1.74 cm^2. - Mitral valve: Calcified annulus. There was trivial regurgitation. - Left atrium: The atrium was mildly dilated. - Tricuspid valve: There was mild regurgitation. - Inferior vena cava: The vessel was mildly dilated. Antimicrobials:  Anti-infectives (From admission, onward)   Start     Dose/Rate Route Frequency Ordered Stop   06/07/18 1400  piperacillin-tazobactam (ZOSYN) IVPB 3.375 g     3.375 g 12.5 mL/hr over 240 Minutes Intravenous Every 8 hours 06/07/18 0534     06/07/18 0400  piperacillin-tazobactam (ZOSYN) IVPB 3.375 g     3.375 g 12.5 mL/hr over 240 Minutes Intravenous  Once 06/07/18 0351 06/07/18 0836       Objective: Vitals:   06/08/18 2100 06/09/18 0020 06/09/18 0525 06/09/18 0821  BP:  (!) 135/101 (!) 135/46 (!) 144/66  Pulse:  65 (!) 58 (!) 59  Resp:  (!) 21 16 13   Temp:  98.3 F (36.8 C) 98.1 F (36.7 C) 97.9 F (36.6 C)  TempSrc:  Oral Oral Oral  SpO2:  99% 96% 99%  Weight:      Height: 5\' 3"  (1.6 m)       Intake/Output Summary (Last 24 hours) at 06/09/2018 1347 Last data filed at 06/09/2018 1204 Gross per 24 hour  Intake 3077.58 ml  Output 1750 ml  Net 1327.58 ml   Filed Weights   06/07/18 2000  Weight: 63.7 kg    Examination: General exam: Appears comfortable  HEENT: PERRLA, oral mucosa moist, no sclera icterus or thrush Respiratory system: Clear to auscultation. Respiratory effort normal. Cardiovascular system: S1 & S2 heard, RRR.   Gastrointestinal system: Abdomen  soft, non-tender, nondistended. Normal bowel sounds. Central nervous system: Alert and oriented. No focal neurological deficits. Extremities: No cyanosis, clubbing or edema Skin: No rashes or ulcers Psychiatry:  Mood & affect appropriate.     Data Reviewed: I have personally reviewed following labs and imaging studies  CBC: Recent Labs  Lab 06/06/18 2356 06/08/18 0331 06/09/18 0511  WBC 13.2* 6.7 7.7  HGB 10.4* 9.0* 9.3*  HCT 32.0* 28.8* 30.5*  MCV 92.8 95.7 97.4  PLT 438* 342 321   Basic Metabolic Panel: Recent Labs  Lab 06/06/18 2356 06/08/18 0331 06/08/18 0842 06/09/18 0511  NA 128* 138  --  138  K 4.1 3.6  --  3.2*  CL 91* 105  --  108  CO2 25 25  --  24  GLUCOSE 208* 103*  --  151*  BUN 18 11  --  8  CREATININE 0.84 1.03*  --  0.99  CALCIUM 9.8 8.6*  --  8.3*  MG  --   --  1.8 1.8   GFR: Estimated Creatinine Clearance: 31.2 mL/min (by C-G formula based on SCr of 0.99 mg/dL). Liver Function Tests: Recent Labs  Lab 06/06/18 2356 06/09/18 0511  AST 14* 21  ALT 14 17  ALKPHOS 53 45  BILITOT 0.5 0.2*  PROT 7.4 5.7*  ALBUMIN 3.0* 2.1*   Recent Labs  Lab 06/06/18 2356  LIPASE 102*   No results for input(s): AMMONIA in the last 168 hours. Coagulation Profile: No results for input(s): INR, PROTIME in the last 168 hours. Cardiac Enzymes: Recent Labs  Lab 06/07/18 1513 06/08/18 0842  TROPONINI <0.03 <0.03   BNP (last 3 results) No results for input(s): PROBNP in the last 8760 hours. HbA1C: No results for input(s): HGBA1C in the last 72 hours. CBG: Recent Labs  Lab 06/08/18 1124 06/08/18 1633 06/08/18 2122 06/09/18 0554 06/09/18 1123  GLUCAP 118* 143* 138* 120* 95   Lipid Profile: No results for input(s): CHOL, HDL, LDLCALC, TRIG, CHOLHDL, LDLDIRECT in the last 72 hours. Thyroid Function Tests: Recent Labs    06/07/18 1513  TSH 1.687   Anemia Panel: No results for input(s): VITAMINB12, FOLATE, FERRITIN, TIBC, IRON, RETICCTPCT in the  last 72 hours. Urine analysis:    Component Value Date/Time   COLORURINE YELLOW 06/07/2018 0000   APPEARANCEUR HAZY (A) 06/07/2018 0000   LABSPEC 1.014 06/07/2018 0000   PHURINE 5.0 06/07/2018 0000   GLUCOSEU 50 (A) 06/07/2018 0000   HGBUR NEGATIVE 06/07/2018 0000   BILIRUBINUR NEGATIVE 06/07/2018 0000   KETONESUR 5 (A) 06/07/2018 0000   PROTEINUR 30 (A) 06/07/2018 0000   NITRITE NEGATIVE 06/07/2018 0000   LEUKOCYTESUR MODERATE (A) 06/07/2018 0000   Sepsis Labs: @LABRCNTIP (procalcitonin:4,lacticidven:4) ) Recent Results (from the past 240 hour(s))  Urine Culture     Status: Abnormal   Collection Time: 06/07/18 12:00 AM  Result  Value Ref Range Status   Specimen Description URINE, RANDOM  Final   Special Requests   Final    NONE Performed at [facility], [address] [place], [place] [address]    Culture >=100,000 COLONIES/mL ESCHERICHIA COLI (A)  Final   Report Status 06/09/2018 FINAL  Final   Organism ID, Bacteria ESCHERICHIA COLI (A)  Final      Susceptibility   Escherichia coli - MIC*    AMPICILLIN 4 SENSITIVE Sensitive     CEFAZOLIN <=4 SENSITIVE Sensitive     CEFTRIAXONE <=1 SENSITIVE Sensitive     CIPROFLOXACIN <=0.25 SENSITIVE Sensitive     GENTAMICIN <=1 SENSITIVE Sensitive     IMIPENEM <=0.25 SENSITIVE Sensitive     NITROFURANTOIN <=16 SENSITIVE Sensitive     TRIMETH/SULFA <=20 SENSITIVE Sensitive     AMPICILLIN/SULBACTAM <=2 SENSITIVE Sensitive     PIP/TAZO <=4 SENSITIVE Sensitive     Extended ESBL NEGATIVE Sensitive     * >=100,000 COLONIES/mL ESCHERICHIA COLI         Radiology Studies: Dg Knee Right Port  Result Date: 06/09/2018 CLINICAL DATA:  Posterior right knee pain EXAM: PORTABLE RIGHT KNEE - 1-2 VIEW COMPARISON:  None FINDINGS: Advanced tricompartment degenerative changes with joint space narrowing and spurring. Small joint effusion. Rounded corticated calcifications inferior to the patella in the anterior soft tissues, likely related to  old injury. No acute fracture, subluxation or dislocation. IMPRESSION: Advanced osteoarthritis. Small joint effusion. No acute bony abnormality. Electronically Signed   By: Charlett NoseKevin  Dover M.D.   On: 06/09/2018 09:47    CT abd/pelvis: IMPRESSION: 1. Fluid-filled gastric outpouching from the lesser curvature. There is significant surrounding inflammatory change. Findings are suspicious for early or contained gastric perforation, possible peptic ulcer disease, less likely gastric diverticulum which is inflamed. No extraluminal air. Recommend surgical consultation. 2. Chronic findings include gallstones and colonic diverticulosis without diverticulitis. Small hiatal hernia. 3. 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 This was described on outside prior exam. 4. Possible pelvic congestion. 5.  Aortic Atherosclerosis (ICD10-I70.0).  UGI:  IMPRESSION: 1. Deep gastric ulcer/contained perforation along the posterior wall of the stomach. 2. Prominent gastroesophageal reflux.    Scheduled Meds: . [START ON 06/10/2018] pantoprazole  40 mg Intravenous Q12H   Continuous Infusions: . dextrose 5 % and 0.9% NaCl 100 mL/hr at 06/08/18 1404  . pantoprozole (PROTONIX) infusion 8 mg/hr (06/09/18 1000)  . piperacillin-tazobactam (ZOSYN)  IV 3.375 g (06/09/18 0519)     LOS: 2 days      Calvert CantorSaima Zian Delair, MD [facility] Pager: [email or web address] Password [email or web address] 06/09/2018, 1:47 PM

## 2018-06-09 NOTE — Plan of Care (Signed)

## 2018-06-09 NOTE — Consult Note (Signed)
Consultation Note Date: 06/09/2018   Patient Name: Briana Martinez  DOB: 05/10/1922  MRN: [id number]  Age / Sex: 85 y.o., female  PCP: Kendell Bane, MD Referring Physician: Calvert Cantor, MD  Reason for Consultation: Establishing goals of care  HPI/Patient Profile: 85 y.o. female  with past medical history of diabetes without complication, history of stroke, cataracts, history of knee joint replacement, hysterectomy, admitted on 06/06/2018 with gastric ulcer with contained perforation.  Palliative medicine team consulted for goals of care  Clinical Assessment and Goals of Care: Mrs. Drilling is sitting up in the [place] chair in her room.  She greets me making and keeping eye contact.  She is alert and oriented, calm and cooperative.  She is full of energy, very talkative.  Present today at bedside is her only child, daughter "Fannie Knee" Alger Memos.  Fannie Knee states that she wants her daughter-in-law Wynona Canes to be involved in decision-making.  She gets Wynona Canes on the phone who asks logistics related to returning home and benefits of hospice.  I share that Mrs. Peyton Bottoms and I will discuss choices and have Fannie Knee call her with results.  Mrs. 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  She tells me details about her home life including her ramps coming into her home, doing her own laundry, taking showers on her own, but she no longer drives.  She and Bonita Quin states that Mrs. Grandinetti is still active pulling weeds in her yard.  We talked about her acute health problem, but daughter Fannie Knee interjects that her mother does not want to hear anything about a possible cancer.  We talked about her ulcer and perforation.  Mrs. Norway states several times that she feels that her ulcer is healing, and that she believes it will heal.  I share that we will care for her, treating the treatable even if her ulcer  does not heal.  She tells about her mother struggle with stomach ulcers and that she had part of her stomach removed.  We talked about surgery, not being offered, Mrs. Guevara states that she would not want surgery regardless.  Mrs. Mundine tells me that her main goal is to return to her home.  Daughter Fannie Knee states that they plan to go from Mrs. [facility] home in [place] to [facility] home in [place].  Mrs. Gorley tells me that she has someone to help clean from 8-10 every Thursday.  We talked about in-home services, what is available.  I share hospice services, already paid for by her Medicare, free in-home services including registered nurse, aide to help with bathing.  Mrs. Ginn states that she would like this service.  We talked about preferred place of death.  Mrs. Capp quickly states that her preferred place of death is home.  We talked about how to care for Mrs. Carnahan when she gets sick again.  We talked about rehospitalization, Fannie Knee initially states that her mother "must be" hospitalized.  Mrs. Hiler states that she  would not want to be hospitalized unless she could not be made comfortable at home.  We discussed hospice making her comfortable at home.  I share the case management will help set up in-home hospice services, with a goal to find a provider that will serve her both in [place] and [place].  We discussed CODE STATUS.  At first Mrs. Katrinka BlazingSmith states that she wants everything done to keep her alive.  We talked further about what this would look like, feel like.  I discussed the concept of treat the treatable, but when her heart naturally stops, we allow a natural death.  Mrs. Katrinka BlazingSmith looks to her daughter for advice.  She endorses allowing natural death.  [name] states that she can agree with this.  CODE STATUS changed.  MOST form completed, return to room to have daughter son, but [name] is not present at this time.  PMT will follow-up tomorrow.  Conference with case management related to goals  of care, requesting "treat the treatable" hospice services for [place]. Conference with hospitalist related to goals of care discussion. Palliative medicine to follow-up 1/9.   HCPOA    NEXT OF KIN - daughter "[name]" Alger MemosLinda Brown.  Mrs. Michaelle CopasSmith's husband died in [id number] on their 34th wedding anniversary.   SUMMARY OF RECOMMENDATIONS   At this point Mrs. Katrinka BlazingSmith wants to stay for further testing UGI on Friday. Her goal is to eat food, not live with TPN as her source of nutrition.  Code Status/Advance Care Planning:  DNR  Symptom Management:   Per hospitalist, no additional needs at this time.  Palliative Prophylaxis:   Frequent Pain Assessment and Turn Reposition  Additional Recommendations (Limitations, Scope, Preferences):  Treat the treatable but no CPR, no intubation  Psycho-social/Spiritual:   Desire for further Chaplaincy support:no  Additional Recommendations: Caregiving  Support/Resources and Education on Hospice  Prognosis:  < 3 months, would not be surprising based on acute gastric ulcer with perforation.  Discharge Planning: Goal is to return to home with treat the treatable hospice services after UGI testing on Friday      Primary Diagnoses: Present on Admission: . Abdominal pain . Leukocytosis . Asymptomatic bacteriuria . Hyponatremia   I have reviewed the medical record, interviewed the patient and family, and examined the patient. The following aspects are pertinent.  Past Medical History:  Diagnosis Date  . Abdominal pain 06/2018  . AVB (atrioventricular block)   . Cataract   . Complication of anesthesia    " The doctor told me not to be put tp sleep again, I dont know why "  . Diabetes mellitus without complication (HCC)   . Stroke [facility](HCC)    Social History   Socioeconomic History  . Marital status: Widowed    Spouse name: Not on file  . Number of children: Not on file  . Years of education: Not on file  . Highest  education level: Not on file  Occupational History  . Not on file  Social Needs  . Financial resource strain: Not on file  . Food insecurity:    Worry: Not on file    Inability: Not on file  . Transportation needs:    Medical: Not on file    Non-medical: Not on file  Tobacco Use  . Smoking status: Never Smoker  . Smokeless tobacco: Never Used  Substance and Sexual Activity  . Alcohol use: Not Currently  . Drug use: Never  . Sexual activity: Not on file  Lifestyle  . Physical activity:    Days per week: Not on file    Minutes per session: Not on file  . Stress: Not on file  Relationships  . Social connections:    Talks on phone: Not on file    Gets together: Not on file    Attends religious service: Not on file    Active member of club or organization: Not on file    Attends meetings of clubs or organizations: Not on file    Relationship status: Not on file  Other Topics Concern  . Not on file  Social History Narrative  . Not on file   History reviewed. No pertinent family history. Scheduled Meds: . [START ON 06/10/2018] pantoprazole  40 mg Intravenous Q12H   Continuous Infusions: . dextrose 5 % and 0.9% NaCl 100 mL/hr at 06/08/18 1404  . pantoprozole (PROTONIX) infusion 8 mg/hr (06/09/18 1000)  . piperacillin-tazobactam (ZOSYN)  IV 3.375 g (06/09/18 0519)  . potassium chloride 10 mEq (06/09/18 0954)   PRN Meds:.acetaminophen **OR** acetaminophen, albuterol, ondansetron **OR** ondansetron (ZOFRAN) IV Medications Prior to Admission:  Prior to Admission medications   Medication Sig Start Date End Date Taking? Authorizing Provider  amLODipine (NORVASC) 5 MG tablet Take 5 mg by mouth daily.   Yes [provider]  aspirin 325 MG tablet Take 325 mg by mouth daily.   Yes [provider]  cholecalciferol (VITAMIN D3) 25 MCG (1000 UT) tablet Take 2,000 Units by mouth daily.   Yes [provider]  losartan (COZAAR) 50 MG tablet Take 50 mg by mouth  daily.   Yes [provider]  Multiple Vitamin (MULTI-VITAMIN PO) Take 1 tablet by mouth daily. Reported on 05/28/2015   Yes [provider]  Multiple Vitamins-Minerals (PRESERVISION AREDS PO) Take by mouth 2 (two) times daily. Reported on 05/28/2015   Yes [provider]  pravastatin (PRAVACHOL) 20 MG tablet Take 20 mg by mouth daily.   Yes [provider]  traMADol (ULTRAM) 50 MG tablet Take 1 tablet (50 mg total) by mouth every 6 (six) hours as needed. Patient taking differently: Take 50 mg by mouth every 6 (six) hours as needed for moderate pain.  05/28/15  Yes Carmelina Dane, MD   No Known Allergies Review of Systems  Unable to perform ROS: Age    Physical Exam Vitals signs and nursing note reviewed.  Constitutional:      Comments: Makes and keeps eye contact, elderly, somewhat frail  HENT:     Head: Atraumatic.  Cardiovascular:     Rate and Rhythm: Normal rate.  Pulmonary:     Effort: Pulmonary effort is normal. No respiratory distress.  Abdominal:     General: Abdomen is protuberant. There is no distension.  Skin:    General: Skin is warm and dry.  Neurological:     Mental Status: She is oriented to person, place, and time.  Psychiatric:        Mood and Affect: Mood is not anxious or depressed.     Vital Signs: BP (!) 144/66   Pulse (!) 59   Temp 97.9 F (36.6 C) (Oral)   Resp 13   Ht 5\' 3"  (1.6 m)   Wt 63.7 kg   SpO2 99%   BMI 24.88 kg/m  Pain Scale: 0-10   Pain Score: 0-No pain   SpO2: SpO2: 99 % O2 Device:SpO2: 99 % O2 Flow Rate: .O2 Flow Rate (L/min): 2 L/min  IO: Intake/output summary:  Intake/Output Summary (Last 24 hours) at 06/09/2018 1039 Last data filed at 06/09/2018 [id number] Gross per 24 hour  Intake 3077.58 ml  Output 1050 ml  Net 2027.58 ml    LBM: Last BM Date: 06/03/18 Baseline Weight: Weight: 63.7 kg Most recent weight: Weight: 63.7 kg     Palliative Assessment/Data:   Flowsheet Rows     Most  Recent Value  Intake Tab  Referral Department  Hospitalist  Unit at Time of Referral  Cardiac/Telemetry Unit  Date Notified  06/08/18  Palliative Care Type  New Palliative care  Reason for referral  Clarify Goals of Care  Date of Admission  06/07/18  Date first seen by Palliative Care  06/09/18  # of days Palliative referral response time  1 Day(s)  # of days IP prior to Palliative referral  1  Clinical Assessment  Psychosocial & Spiritual Assessment  Palliative Care Outcomes      Time In: 1320 Time Out: 1430 Time Total: 70 minutes Greater than 50%  of this time was spent counseling and coordinating care related to the above assessment and plan.  Signed by: Katheran Aweasha A Dove, NP   Please contact Palliative Medicine Team phone at [phone] for questions and concerns.  For individual provider: See Loretha StaplerAmion

## 2018-06-10 DIAGNOSIS — K631 Perforation of intestine (nontraumatic): Secondary | ICD-10-CM

## 2018-06-10 LAB — BASIC METABOLIC PANEL
Anion gap: 7 (ref 5–15)
BUN: 5 mg/dL — ABNORMAL LOW (ref 8–23)
CO2: 23 mmol/L (ref 22–32)
Calcium: 8.3 mg/dL — ABNORMAL LOW (ref 8.9–10.3)
Chloride: 108 mmol/L (ref 98–111)
Creatinine, Ser: 0.92 mg/dL (ref 0.44–1.00)
GFR, EST NON AFRICAN AMERICAN: 53 mL/min — AB (ref >60)
Glucose, Bld: 138 mg/dL — ABNORMAL HIGH (ref 70–99)
Potassium: 3.2 mmol/L — ABNORMAL LOW (ref 3.5–5.1)
Sodium: 138 mmol/L (ref 135–145)

## 2018-06-10 LAB — GLUCOSE, CAPILLARY
Glucose-Capillary: 140 mg/dL — ABNORMAL HIGH (ref 70–99)
Glucose-Capillary: 141 mg/dL — ABNORMAL HIGH (ref 70–99)
Glucose-Capillary: 153 mg/dL — ABNORMAL HIGH (ref 70–99)
Glucose-Capillary: 139 mg/dL — ABNORMAL HIGH (ref 70–99)

## 2018-06-10 LAB — MAGNESIUM: Magnesium: 1.7 mg/dL (ref 1.7–2.4)

## 2018-06-10 MED ORDER — MAGNESIUM SULFATE 2 GM/50ML IV SOLN
2.0000 g | Freq: Once | INTRAVENOUS | Status: AC
  Administered 2018-06-10: 2 g via INTRAVENOUS
  Filled 2018-06-10: qty 50

## 2018-06-10 MED ORDER — SODIUM CHLORIDE 0.9 % IV SOLN
8.0000 mg/h | INTRAVENOUS | Status: DC
  Administered 2018-06-10 – 2018-06-11 (×2): 8 mg/h via INTRAVENOUS
  Filled 2018-06-10 (×5): qty 80

## 2018-06-10 MED ORDER — POTASSIUM CHLORIDE 10 MEQ/100ML IV SOLN
10.0000 meq | INTRAVENOUS | Status: AC
  Administered 2018-06-10 (×6): 10 meq via INTRAVENOUS
  Filled 2018-06-10 (×6): qty 100

## 2018-06-10 MED ORDER — METOPROLOL TARTRATE 5 MG/5ML IV SOLN: 2.5000 mg | Freq: Four times a day (QID) | INTRAVENOUS | Status: DC | PRN

## 2018-06-10 NOTE — Care Management Important Message (Signed)
Important Message  Patient Details  Name: Briana Martinez MRN: [id number] Date of Birth: 1921/09/06   Medicare Important Message Given:  Yes    Oralia Rud Lucendia Leard 06/10/2018, 4:28 PM

## 2018-06-10 NOTE — Progress Notes (Signed)
Visitor at bedside.  Patient alert talking and very pleasant and smiling. Pt nurse Isabelle Course suggested that chaplain visit patient privately due to the nature of her need.    Briana Martinez, [place], [facility], Pager [phone]

## 2018-06-10 NOTE — Progress Notes (Addendum)
PROGRESS NOTE    Briana Martinez   UJW:[id number]  DOB: 1921-06-27  DOA: 06/06/2018 PCP: Kendell BaneBell, William, MD   Brief Narrative:  Briana BurGeneva M Coy 85 y.o.femalewith medical history significant ofCVA, and DM type II; who presents with complaints of 2 weeks of epigastric abdominal pain, intermittent vomiting and loss of appetite along with weight loss. Further imaging shows a deep gastric ulcer with a contained perforation along the posterior wall of the stomach.  Gen surgery consulted.    Subjective: No complaints. Noted to be crying. Does not want to talk about it. Per RN, she has been crying/ sad since conversation with palliative care yesterday.  Assessment & Plan:   Principal Problem:   Gastric ulcer with contained perforation - see UGI and Ct abdomen results below -  management per gen surgery- patient opting for a less aggressive approach after discussion with palliative care - GI does not recommend EGD at this time - on PPI, bowel rest and Zosyn- cont IVF - Gen surgery plans for a repeat upper GI tomorrow-   Active Problems:   Leukocytosis - WBC 13.2- - ? Due to above- has normalized  Hypokalemia/ hyponatremia due to dehydration - replacing via IV again today- replace Mg as well    Asymptomatic bacteriuria  H/o HTN -on Amlodipine and Cozaar at home- BP slightly elevated- avoiding oral meds in setting of perforation- PRN Lopressor and Hydralazine ordered   H/o CVA - on ASA 325 and Pravastatin as outpt  A-fib, 2nd degree type 1 AVB, Vtach - 1 tele strip showing A-fib - noted to have bradycardia on the night of 1/6 with 2nd degree type 1 AVB while asleep (see strip in Epic) which resolved when she awakened - 2 D ECHO noted below did not show any significant abnormality - noted to have non sustatained V tach on monitor today- replacing electrolytes - no further work up for now-    Time spent in minutes: 30 min-  DVT prophylaxis: SCDs Code Status: DNR Family  Communication:  Disposition Plan: per gen surgery-    Consultants:   gen surgery  Palliative care Procedures:  2 D ECHO Study Conclusions  - Left ventricle: The cavity size was normal. Wall thickness was   increased in a pattern of mild LVH. Systolic function was normal.   The estimated ejection fraction was in the range of 60% to 65%.   Wall motion was normal; there were no regional wall motion   abnormalities. Left ventricular diastolic function parameters   were normal. - Aortic valve: Trileaflet; mildly thickened, mildly calcified   leaflets. Valve area (VTI): 1.42 cm^2. Valve area (Vmax): 1.56   cm^2. Valve area (Vmean): 1.74 cm^2. - Mitral valve: Calcified annulus. There was trivial regurgitation. - Left atrium: The atrium was mildly dilated. - Tricuspid valve: There was mild regurgitation. - Inferior vena cava: The vessel was mildly dilated. Antimicrobials:  Anti-infectives (From admission, onward)   Start     Dose/Rate Route Frequency Ordered Stop   06/07/18 1400  piperacillin-tazobactam (ZOSYN) IVPB 3.375 g     3.375 g 12.5 mL/hr over 240 Minutes Intravenous Every 8 hours 06/07/18 0534     06/07/18 0400  piperacillin-tazobactam (ZOSYN) IVPB 3.375 g     3.375 g 12.5 mL/hr over 240 Minutes Intravenous  Once 06/07/18 0351 06/07/18 0836       Objective: Vitals:   06/09/18 1900 06/09/18 1956 06/10/18 0424 06/10/18 0851  BP: (!) 159/53 (!) 142/61 (!) 152/48 (!) 166/50  Pulse:  66 61 62 (!) 56  Resp: 20 (!) 21 19 19   Temp:  (!) 97.5 F (36.4 C) 97.8 F (36.6 C) 97.7 F (36.5 C)  TempSrc:  Oral Oral Oral  SpO2: 100% 99% 97% 97%  Weight:      Height:        Intake/Output Summary (Last 24 hours) at 06/10/2018 1431 Last data filed at 06/10/2018 0346 Gross per 24 hour  Intake 364.5 ml  Output 800 ml  Net -435.5 ml   Filed Weights   06/07/18 2000  Weight: 63.7 kg    Examination: General exam: Appears comfortable  HEENT: PERRLA, oral mucosa moist, no sclera  icterus or thrush Respiratory system: Clear to auscultation. Respiratory effort normal. Cardiovascular system: S1 & S2 heard, RRR.   Gastrointestinal system: Abdomen soft, non-tender, nondistended. Normal bowel sounds. Central nervous system: Alert and oriented. No focal neurological deficits. Extremities: No cyanosis, clubbing or edema Skin: No rashes or ulcers Psychiatry:  depressed    Data Reviewed: I have personally reviewed following labs and imaging studies  CBC: Recent Labs  Lab 06/06/18 2356 06/08/18 0331 06/09/18 0511  WBC 13.2* 6.7 7.7  HGB 10.4* 9.0* 9.3*  HCT 32.0* 28.8* 30.5*  MCV 92.8 95.7 97.4  PLT 438* 342 321   Basic Metabolic Panel: Recent Labs  Lab 06/06/18 2356 06/08/18 0331 06/08/18 0842 06/09/18 0511 06/10/18 0312  NA 128* 138  --  138 138  K 4.1 3.6  --  3.2* 3.2*  CL 91* 105  --  108 108  CO2 25 25  --  24 23  GLUCOSE 208* 103*  --  151* 138*  BUN 18 11  --  8 5*  CREATININE 0.84 1.03*  --  0.99 0.92  CALCIUM 9.8 8.6*  --  8.3* 8.3*  MG  --   --  1.8 1.8 1.7   GFR: Estimated Creatinine Clearance: 33.6 mL/min (by C-G formula based on SCr of 0.92 mg/dL). Liver Function Tests: Recent Labs  Lab 06/06/18 2356 06/09/18 0511  AST 14* 21  ALT 14 17  ALKPHOS 53 45  BILITOT 0.5 0.2*  PROT 7.4 5.7*  ALBUMIN 3.0* 2.1*   Recent Labs  Lab 06/06/18 2356  LIPASE 102*   No results for input(s): AMMONIA in the last 168 hours. Coagulation Profile: No results for input(s): INR, PROTIME in the last 168 hours. Cardiac Enzymes: Recent Labs  Lab 06/07/18 1513 06/08/18 0842  TROPONINI <0.03 <0.03   BNP (last 3 results) No results for input(s): PROBNP in the last 8760 hours. HbA1C: No results for input(s): HGBA1C in the last 72 hours. CBG: Recent Labs  Lab 06/09/18 1123 06/09/18 1635 06/09/18 2249 06/10/18 0638 06/10/18 1126  GLUCAP 95 112* 117* 140* 141*   Lipid Profile: No results for input(s): CHOL, HDL, LDLCALC, TRIG, CHOLHDL,  LDLDIRECT in the last 72 hours. Thyroid Function Tests: Recent Labs    06/07/18 1513  TSH 1.687   Anemia Panel: No results for input(s): VITAMINB12, FOLATE, FERRITIN, TIBC, IRON, RETICCTPCT in the last 72 hours. Urine analysis:    Component Value Date/Time   COLORURINE YELLOW 06/07/2018 0000   APPEARANCEUR HAZY (A) 06/07/2018 0000   LABSPEC 1.014 06/07/2018 0000   PHURINE 5.0 06/07/2018 0000   GLUCOSEU 50 (A) 06/07/2018 0000   HGBUR NEGATIVE 06/07/2018 0000   BILIRUBINUR NEGATIVE 06/07/2018 0000   KETONESUR 5 (A) 06/07/2018 0000   PROTEINUR 30 (A) 06/07/2018 0000   NITRITE NEGATIVE 06/07/2018 0000   LEUKOCYTESUR MODERATE (A)  06/07/2018 0000   Sepsis Labs: @LABRCNTIP (procalcitonin:4,lacticidven:4) ) Recent Results (from the past 240 hour(s))  Urine Culture     Status: Abnormal   Collection Time: 06/07/18 12:00 AM  Result Value Ref Range Status   Specimen Description URINE, RANDOM  Final   Special Requests   Final    NONE Performed at [facility], [address], [place], [place] [address]    Culture >=100,000 COLONIES/mL ESCHERICHIA COLI (A)  Final   Report Status 06/09/2018 FINAL  Final   Organism ID, Bacteria ESCHERICHIA COLI (A)  Final      Susceptibility   Escherichia coli - MIC*    AMPICILLIN 4 SENSITIVE Sensitive     CEFAZOLIN <=4 SENSITIVE Sensitive     CEFTRIAXONE <=1 SENSITIVE Sensitive     CIPROFLOXACIN <=0.25 SENSITIVE Sensitive     GENTAMICIN <=1 SENSITIVE Sensitive     IMIPENEM <=0.25 SENSITIVE Sensitive     NITROFURANTOIN <=16 SENSITIVE Sensitive     TRIMETH/SULFA <=20 SENSITIVE Sensitive     AMPICILLIN/SULBACTAM <=2 SENSITIVE Sensitive     PIP/TAZO <=4 SENSITIVE Sensitive     Extended ESBL NEGATIVE Sensitive     * >=100,000 COLONIES/mL ESCHERICHIA COLI         Radiology Studies: Dg Knee Right Port  Result Date: 06/09/2018 CLINICAL DATA:  Posterior right knee pain EXAM: PORTABLE RIGHT KNEE - 1-2 VIEW COMPARISON:  None FINDINGS: Advanced  tricompartment degenerative changes with joint space narrowing and spurring. Small joint effusion. Rounded corticated calcifications inferior to the patella in the anterior soft tissues, likely related to old injury. No acute fracture, subluxation or dislocation. IMPRESSION: Advanced osteoarthritis. Small joint effusion. No acute bony abnormality. Electronically Signed   By: Charlett Nose M.D.   On: 06/09/2018 09:47    CT abd/pelvis: IMPRESSION: 1. Fluid-filled gastric outpouching from the lesser curvature. There is significant surrounding inflammatory change. Findings are suspicious for early or contained gastric perforation, possible peptic ulcer disease, less likely gastric diverticulum which is inflamed. No extraluminal air. Recommend surgical consultation. 2. Chronic findings include gallstones and colonic diverticulosis without diverticulitis. Small hiatal hernia. 3. 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 This was described on outside prior exam. 4. Possible pelvic congestion. 5.  Aortic Atherosclerosis (ICD10-I70.0).  UGI:  IMPRESSION: 1. Deep gastric ulcer/contained perforation along the posterior wall of the stomach. 2. Prominent gastroesophageal reflux.    Scheduled Meds:  Continuous Infusions: . dextrose 5 % and 0.9% NaCl 100 mL/hr at 06/10/18 0901  . pantoprozole (PROTONIX) infusion    . piperacillin-tazobactam (ZOSYN)  IV 3.375 g (06/10/18 0916)  . potassium chloride 10 mEq (06/10/18 1253)     LOS: 3 days      Calvert Cantor, MD [facility] Pager: [email or web address] Password [facility] 06/10/2018, 2:31 PM

## 2018-06-10 NOTE — Progress Notes (Signed)
Physical Therapy Treatment Patient Details Name: Briana Martinez MRN: [id number] DOB: [birth year] Today's Date: 06/10/2018    History of Present Illness 85yo female c/o epigastric pain, malaise and weakness, CT concerning for possible perforation, diverticulum, or peptic ulcer. PMH DM, CVA, joint replacement, HOH     PT Comments     Patient progressing slowly towards her physical therapy goals. Assisted patient to bathroom with walker and patient with urinary incontinence on the floor. Performing functional mobility at a min guard assist level. Presents as high fall risk in setting of history of falls, decreased gait speed and decreased safety awareness. Updated d/c plan to reflect recommendation of 24/7 supervision/assist.    Follow Up Recommendations  Home health PT;Supervision/Assistance - 24 hour [facility](HH aide; if does not have 24/7, SNF)     Equipment Recommendations  3in1 (PT)    Recommendations for Other Services       Precautions / Restrictions Precautions Precautions: Fall Restrictions Weight Bearing Restrictions: No    Mobility  Bed Mobility Overal bed mobility: Needs Assistance Bed Mobility: Supine to Sit     Supine to sit: Supervision        Transfers Overall transfer level: Needs assistance Equipment used: Rolling walker (2 wheeled) Transfers: Sit to/from Stand Sit to Stand: Min assist         General transfer comment: up to min assist from lower surface of toilet  Ambulation/Gait Ambulation/Gait assistance: Min guard Gait Distance (Feet): 30 Feet Assistive device: Rolling walker (2 wheeled) Gait Pattern/deviations: Step-through pattern;Decreased step length - right;Decreased step length - left;Trunk flexed;Drifts right/left     General Gait Details: Pt ambulating to and from bathroom, abandoning walker in the bathroom. Pt needs multimodal cues for safe turning as she tends to sit before hips are squared   Stairs             Wheelchair  Mobility    Modified Rankin (Stroke Patients Only)       Balance Overall balance assessment: Needs assistance;History of Falls   Sitting balance-Leahy Scale: Good     Standing balance support: During functional activity;No upper extremity supported Standing balance-Leahy Scale: Fair                              Cognition Arousal/Alertness: Awake/alert Behavior During Therapy: WFL for tasks assessed/performed Overall Cognitive Status: Impaired/Different from baseline Area of Impairment: Memory;Awareness;Problem solving                     Memory: Decreased short-term memory     Awareness: Emergent Problem Solving: Difficulty sequencing;Requires verbal cues;Requires tactile cues        Exercises      General Comments        Pertinent Vitals/Pain Pain Assessment: Faces Faces Pain Scale: No hurt    Home Living                      Prior Function            PT Goals (current goals can now be found in the care plan section) Acute Rehab PT Goals Patient Stated Goal: go home Potential to Achieve Goals: Good Progress towards PT goals: Progressing toward goals    Frequency    Min 3X/week      PT Plan Discharge plan needs to be updated    Co-evaluation  AM-PAC PT "6 Clicks" Mobility   Outcome Measure  Help needed turning from your back to your side while in a flat bed without using bedrails?: None Help needed moving from lying on your back to sitting on the side of a flat bed without using bedrails?: None Help needed moving to and from a bed to a chair (including a wheelchair)?: A Little Help needed standing up from a chair using your arms (e.g., wheelchair or bedside chair)?: A Little Help needed to walk in hospital room?: A Little Help needed climbing 3-5 steps with a railing? : A Lot 6 Click Score: 19    End of Session Equipment Utilized During Treatment: Gait belt Activity Tolerance: Patient  tolerated treatment well Patient left: in bed;with call bell/phone within reach;with bed alarm set;with nursing/sitter in room Nurse Communication: Mobility status PT Visit Diagnosis: Unsteadiness on feet (R26.81);Muscle weakness (generalized) (M62.81);History of falling (Z91.81)     Time: [id number] PT Time Calculation (min) (ACUTE ONLY): 18 min  Charges:  $Gait Training: 8-22 mins                     Laurina Bustlearoline Leina Babe, [place] CarolinaPT, DPT Acute Rehabilitation Services Pager [phone] Office [phone]    Vanetta MuldersCarloine H Dason Mosley 06/10/2018, 12:38 PM

## 2018-06-10 NOTE — Progress Notes (Addendum)
Palliative:   Briana Martinez is sitting up in the Briana Martinez chair in her room.  She greets me making and mostly keeping eye contact.  She is calm and cooperative, alert and oriented x3.  Present today at bedside is daughter Briana Martinez.  We talked about what is next.  At this point, awaiting UGI testing, is her ulcer/perforation healing, is she able to take liquids.  We talked about the what if's and maybe's.  I question if the perforation has not healed, would she accept TPN if unable to take PO.  Today Briana Martinez tells me that she would accept TPN if she were unable to eat.  I ask, "how long for TPN?".    If she is not healing after 1 week, 1 month, when would she stop TPN?  I share that I am not asking her to make this choice at this time, but consider that there would probably be a time limit on how her body would be able to continue with TPN.  Patient and daughter Briana Martinez are considering these choices.   At one point Briana Martinez states, "maybe I'll die soon nd this will all be over".  Thankfully Briana Martinez does not correct her.  We allow a moment of silence, and I share that we will continue to care for her regardless.  Briana Martinez tells of her neighbors who have all passed.   It is very important to Briana Martinez that she retains her independence and self worth/active life.   Briana Martinez talks about rehab, Briana Martinez states she feels she could use one week in rehab to get stronger.  We talk about PT and qualifying.  Ultimately Briana Martinez's goal is to return to home. Briana Martinez is willing to help as needed but is caring for her ill husband and lives 50 miles away. They are worried for her safety at home alone.  Briana Martinez is still agreeable to at home, treat the treatable, hospice services.  Conference with nursing staff related to further testing, goals of care, patient psychosocial situation.  She stares at Briana Martinez is been tearful today, stating that staff understands someone talk to her about cancer yesterday. Case discussed with CM related to  further testing, goals of care, disposition.  MOST form completed and on chart.  50 minutes, extended time Briana Carmel, NP  Palliative Medicine Team  Team Phone # [phone]  Greater than 50% of this time was spent counseling and coordinating care related to the above assessment and plan.

## 2018-06-10 NOTE — Evaluation (Signed)
Occupational Therapy Evaluation Patient Details Name: Briana Martinez MRN: [id number] DOB: 03/12/1922 Today's Date: 06/10/2018    History of Present Illness 85yo female c/o epigastric pain, malaise and weakness, CT concerning for possible perforation, diverticulum, or peptic ulcer. PMH DM, CVA, joint replacement, HOH    Clinical Impression   Pt with decline in function and safety with ADLs and ADL mobility with decreased strength, balance and endurance. Pt would benefit from acute OT services to address impairments to maximize level of function and safety    Follow Up Recommendations  SNF(HH OT and aide; if does not have 24/7, SNF)    Equipment Recommendations  None recommended by OT    Recommendations for Other Services       Precautions / Restrictions Precautions Precautions: Fall Restrictions Weight Bearing Restrictions: No LLE Weight Bearing: Weight bearing as tolerated      Mobility Bed Mobility Overal bed mobility: Needs Assistance Bed Mobility: Supine to Sit     Supine to sit: Supervision        Transfers Overall transfer level: Needs assistance Equipment used: Rolling walker (2 wheeled) Transfers: Sit to/from Stand Sit to Stand: Min assist         General transfer comment: up to min assist from lower surface of toilet    Balance Overall balance assessment: Needs assistance;History of Falls Sitting-balance support: Bilateral upper extremity supported;Feet supported Sitting balance-Leahy Scale: Good     Standing balance support: During functional activity;No upper extremity supported Standing balance-Leahy Scale: Fair                             ADL either performed or assessed with clinical judgement   ADL Overall ADL's : Needs assistance/impaired     Grooming: Wash/dry hands;Wash/dry face;Standing;Minimal assistance   Upper Body Bathing: Supervision/ safety;Set up;Sitting   Lower Body Bathing: Moderate assistance   Upper Body  Dressing : Set up;Supervision/safety;Sitting   Lower Body Dressing: Moderate assistance   Toilet Transfer: Minimal assistance;Ambulation;RW;Comfort height toilet;Grab bars;Cueing for safety   Toileting- Clothing Manipulation and Hygiene: Minimal assistance;Sit to/from stand       Functional mobility during ADLs: Minimal assistance;Rolling walker;Cueing for safety       Vision Patient Visual Report: No change from baseline       Perception     Praxis      Pertinent Vitals/Pain Pain Assessment: 0-10 Pain Score: 0-No pain Faces Pain Scale: No hurt Pain Intervention(s): Monitored during session     Hand Dominance Right   Extremity/Trunk Assessment Upper Extremity Assessment Upper Extremity Assessment: Generalized weakness   Lower Extremity Assessment Lower Extremity Assessment: Defer to PT evaluation   Cervical / Trunk Assessment Cervical / Trunk Assessment: Kyphotic   Communication Communication Communication: HOH   Cognition Arousal/Alertness: Awake/alert Behavior During Therapy: WFL for tasks assessed/performed Overall Cognitive Status: Impaired/Different from baseline Area of Impairment: Memory;Awareness;Problem solving                     Memory: Decreased short-term memory     Awareness: Emergent Problem Solving: Difficulty sequencing;Requires verbal cues;Requires tactile cues     General Comments       Exercises     Shoulder Instructions      Home Living Family/patient expects to be discharged to:: Private residence Living Arrangements: Alone Available Help at Discharge: Family;Available PRN/intermittently Type of Home: House Home Access: Ramped entrance     Home Layout: One level  Bathroom Shower/Tub: Chief Strategy Officer: Standard     Home Equipment: Environmental consultant - 2 wheels;Cane - single point   Additional Comments: also has a service that comes and cleans for her once a week, she is able to sponge bathe and cook  her own meals. No room to use RW in house so she uses cane inside/RW outside       Prior Functioning/Environment Level of Independence: Independent with assistive device(s)                 OT Problem List: Decreased strength;Decreased activity tolerance;Decreased cognition;Decreased knowledge of use of DME or AE;Impaired balance (sitting and/or standing);Decreased safety awareness      OT Treatment/Interventions: Self-care/ADL training;Therapeutic exercise;DME and/or AE instruction;Therapeutic activities;Patient/family education    OT Goals(Current goals can be found in the care plan section) Acute Rehab OT Goals Patient Stated Goal: go home OT Goal Formulation: With patient Time For Goal Achievement: 06/24/18 Potential to Achieve Goals: Good ADL Goals Pt Will Perform Grooming: with min guard assist;with supervision;standing;with caregiver independent in assisting Pt Will Perform Lower Body Bathing: with min assist;sitting/lateral leans;sit to/from stand;with caregiver independent in assisting Pt Will Perform Lower Body Dressing: with min assist;sitting/lateral leans;sit to/from stand;with caregiver independent in assisting Pt Will Transfer to Toilet: with min guard assist;with supervision;ambulating Pt Will Perform Toileting - Clothing Manipulation and hygiene: with min guard assist;sit to/from stand;with caregiver independent in assisting  OT Frequency: Min 2X/week   Barriers to D/C: Other (comment)  HH aide; if does not have 24/7, SNF       Co-evaluation              AM-PAC OT "6 Clicks" Daily Activity     Outcome Measure Help from another person eating meals?: None Help from another person taking care of personal grooming?: A Little Help from another person toileting, which includes using toliet, bedpan, or urinal?: A Lot Help from another person bathing (including washing, rinsing, drying)?: A Lot Help from another person to put on and taking off regular upper  body clothing?: A Little Help from another person to put on and taking off regular lower body clothing?: A Lot 6 Click Score: 16   End of Session Equipment Utilized During Treatment: Gait belt;Rolling walker  Activity Tolerance: Patient tolerated treatment well Patient left: in chair;with call bell/phone within reach;with chair alarm set;with family/visitor present  OT Visit Diagnosis: Unsteadiness on feet (R26.81);Other abnormalities of gait and mobility (R26.89);Muscle weakness (generalized) (M62.81)                Time: 7846-9629 OT Time Calculation (min): 27 min Charges:  OT General Charges $OT Visit: 1 Visit OT Evaluation $OT Eval Low Complexity: 1 Low OT Treatments $Self Care/Home Management : 8-22 mins    Galen Manila 06/10/2018, 1:43 PM

## 2018-06-11 ENCOUNTER — Inpatient Hospital Stay (HOSPITAL_COMMUNITY): Payer: Medicare Other

## 2018-06-11 LAB — BASIC METABOLIC PANEL
Anion gap: 6 (ref 5–15)
BUN: <5 mg/dL — ABNORMAL LOW (ref 8–23)
CO2: 23 mmol/L (ref 22–32)
Calcium: 8.1 mg/dL — ABNORMAL LOW (ref 8.9–10.3)
Chloride: 109 mmol/L (ref 98–111)
Creatinine, Ser: 1 mg/dL (ref 0.44–1.00)
GFR calc Af Amer: 56 mL/min — ABNORMAL LOW (ref >60)
GFR calc non Af Amer: 48 mL/min — ABNORMAL LOW (ref >60)
Glucose, Bld: 135 mg/dL — ABNORMAL HIGH (ref 70–99)
Potassium: 3.6 mmol/L (ref 3.5–5.1)
SODIUM: 138 mmol/L (ref 135–145)

## 2018-06-11 LAB — MAGNESIUM: Magnesium: 2.1 mg/dL (ref 1.7–2.4)

## 2018-06-11 LAB — GLUCOSE, CAPILLARY
Glucose-Capillary: 112 mg/dL — ABNORMAL HIGH (ref 70–99)
Glucose-Capillary: 114 mg/dL — ABNORMAL HIGH (ref 70–99)
Glucose-Capillary: 125 mg/dL — ABNORMAL HIGH (ref 70–99)

## 2018-06-11 MED ORDER — PANTOPRAZOLE SODIUM 40 MG IV SOLR
40.0000 mg | Freq: Two times a day (BID) | INTRAVENOUS | Status: DC
  Administered 2018-06-11 – 2018-06-13 (×6): 40 mg via INTRAVENOUS
  Filled 2018-06-11 (×6): qty 40

## 2018-06-11 MED ORDER — KCL IN DEXTROSE-NACL 40-5-0.9 MEQ/L-%-% IV SOLN
INTRAVENOUS | Status: DC
  Administered 2018-06-11 – 2018-06-12 (×3): via INTRAVENOUS
  Filled 2018-06-11 (×5): qty 1000

## 2018-06-11 MED ORDER — IOHEXOL 300 MG/ML  SOLN
150.0000 mL | Freq: Once | INTRAMUSCULAR | Status: AC | PRN
  Administered 2018-06-11: 150 mL via ORAL

## 2018-06-11 MED ORDER — POTASSIUM CHLORIDE 10 MEQ/100ML IV SOLN
10.0000 meq | INTRAVENOUS | Status: AC
  Administered 2018-06-11 (×4): 10 meq via INTRAVENOUS
  Filled 2018-06-11 (×4): qty 100

## 2018-06-11 NOTE — Progress Notes (Signed)
[facility] Progress Note     Subjective: CC: wants to drink Patient denies abdominal pain or nausea. Reports she has not had any abdominal pain since she has been here. Is passing some flatus. Does not feel bloated.   Objective: Vital signs in last 24 hours: Temp:  [97.6 F (36.4 C)-98 F (36.7 C)] 97.8 F (36.6 C) (01/10 0757) Pulse Rate:  [54-84] 54 (01/10 0825) Resp:  [16-23] 16 (01/10 0825) BP: (149-176)/(49-82) 176/51 (01/10 0825) SpO2:  [96 %-98 %] 98 % (01/10 0825) Last BM Date: 06/10/18  Intake/Output from previous day: 01/09 0701 - 01/10 0700 In: 3810.8 [I.V.:3179.3; IV Piggyback:631.5] Out: -  Intake/Output this shift: Total I/O In: -  Out: 200 [Urine:200]  PE: Gen:  Alert, NAD, pleasant Card:  Irregularly irregular Pulm:  Normal effort, clear to auscultation bilaterally Abd: Soft, non-tender, non-distended, bowel sounds present, no HSM Psych: A&Ox3   Lab Results:  Recent Labs    06/09/18 0511  WBC 7.7  HGB 9.3*  HCT 30.5*  PLT 321   BMET Recent Labs    06/10/18 0312 06/11/18 0359  NA 138 138  K 3.2* 3.6  CL 108 109  CO2 23 23  GLUCOSE 138* 135*  BUN 5* <5*  CREATININE 0.92 1.00  CALCIUM 8.3* 8.1*   PT/INR No results for input(s): LABPROT, INR in the last 72 hours. CMP     Component Value Date/Time   NA 138 06/11/2018 0359   K 3.6 06/11/2018 0359   CL 109 06/11/2018 0359   CO2 23 06/11/2018 0359   GLUCOSE 135 (H) 06/11/2018 0359   BUN <5 (L) 06/11/2018 0359   CREATININE 1.00 06/11/2018 0359   CALCIUM 8.1 (L) 06/11/2018 0359   PROT 5.7 (L) 06/09/2018 0511   ALBUMIN 2.1 (L) 06/09/2018 0511   AST 21 06/09/2018 0511   ALT 17 06/09/2018 0511   ALKPHOS 45 06/09/2018 0511   BILITOT 0.2 (L) 06/09/2018 0511   GFRNONAA 48 (L) 06/11/2018 0359   GFRAA 56 (L) 06/11/2018 0359   Lipase     Component Value Date/Time   LIPASE 102 (H) 06/06/2018 2356       Studies/Results: Dg Knee Right Port  Result Date:  06/09/2018 CLINICAL DATA:  Posterior right knee pain EXAM: PORTABLE RIGHT KNEE - 1-2 VIEW COMPARISON:  None FINDINGS: Advanced tricompartment degenerative changes with joint space narrowing and spurring. Small joint effusion. Rounded corticated calcifications inferior to the patella in the anterior soft tissues, likely related to old injury. No acute fracture, subluxation or dislocation. IMPRESSION: Advanced osteoarthritis. Small joint effusion. No acute bony abnormality. Electronically Signed   By: Charlett Nose M.D.   On: 06/09/2018 09:47    Anti-infectives: Anti-infectives (From admission, onward)   Start     Dose/Rate Route Frequency Ordered Stop   06/07/18 1400  piperacillin-tazobactam (ZOSYN) IVPB 3.375 g     3.375 g 12.5 mL/hr over 240 Minutes Intravenous Every 8 hours 06/07/18 0534     06/07/18 0400  piperacillin-tazobactam (ZOSYN) IVPB 3.375 g     3.375 g 12.5 mL/hr over 240 Minutes Intravenous  Once 06/07/18 0351 06/07/18 0836       Assessment/Plan Hypertension Diabetes mellitus History of CVA Atrial fibrillation Bradycardia with atrial fibrillation/second-degree AV block- ECHO 1/7 EF 60-65% Right knee pain - plain films show osteoarthritis with small effusion  Deep gastric ulcer/contained perforation along the posterior wall of the stomach - UGI today - if ulcer appears to be healing will initiate CLD, if not  we need to decide on TPN - high likelihood of malignancy given recent weight loss - per GIshe's not a candidate for EGD due to contained perf - continue antibiotics, PPI infusion  ID -zosyn 1/6>> FEN -IVF, NPO  VTE -SCDs Foley -wick Follow up -TBD  Plan- Await results of UGI today.   LOS: 4 days    Wells Guiles , [facility] 06/11/2018, 9:22 AM Pager: [phone] Consults: [phone] Mon-Fri 7:00 am-4:30 pm Sat-Sun 7:00 am-11:30 am

## 2018-06-11 NOTE — Progress Notes (Signed)
PROGRESS NOTE    Briana Martinez   YQI:[id number]  DOB: 1921/07/19  DOA: 06/06/2018 PCP: Kendell Bane, MD   Brief Narrative:  Briana Martinez 85 y.o.femalewith medical history significant ofCVA, and DM type II; who presents with complaints of 2 weeks of epigastric abdominal pain, intermittent vomiting and loss of appetite along with weight loss. Further imaging shows a deep gastric ulcer with a contained perforation along the posterior wall of the stomach.  Gen surgery consulted.    Subjective: Upset because she has had a BM in the bed. Crying. She has no other complaints.   Assessment & Plan:   Principal Problem:   Gastric ulcer with contained perforation - see UGI and Ct abdomen results below -  management per gen surgery - patient opting for a less aggressive approach after discussion with palliative care - GI does not recommend EGD at this time - on PPI, bowel rest and Zosyn- cont IVF - has undergone a repeat upper GI today- management per gen surgery  Active Problems:   Leukocytosis - WBC 13.2- - ? Due to above- has normalized  Hypokalemia/ hyponatremia due to dehydration - continue to replace K- cont IVF  Diarrhea - likely due to antibiotics     Asymptomatic bacteriuria  H/o HTN -on Amlodipine and Cozaar at home- BP slightly elevated- avoiding oral meds in setting of perforation- PRN Lopressor and Hydralazine ordered   H/o CVA - on ASA 325 and Pravastatin as outpt  A-fib, 2nd degree type 1 AVB, Vtach - 1 tele strip showing A-fib - noted to have bradycardia on the night of 1/6 with 2nd degree type 1 AVB while asleep (see strip in Epic) which resolved when she awakened - 2 D ECHO noted below did not show any significant abnormality - noted to have non sustatained V tach on monitor  On 1/9- replacing electrolytes - no further work up for now-    Time spent in minutes: 30 min-  DVT prophylaxis: SCDs Code Status: DNR Family Communication:  Disposition  Plan: per gen surgery-    Consultants:   gen surgery  Palliative care Procedures:  2 D ECHO Study Conclusions  - Left ventricle: The cavity size was normal. Wall thickness was   increased in a pattern of mild LVH. Systolic function was normal.   The estimated ejection fraction was in the range of 60% to 65%.   Wall motion was normal; there were no regional wall motion   abnormalities. Left ventricular diastolic function parameters   were normal. - Aortic valve: Trileaflet; mildly thickened, mildly calcified   leaflets. Valve area (VTI): 1.42 cm^2. Valve area (Vmax): 1.56   cm^2. Valve area (Vmean): 1.74 cm^2. - Mitral valve: Calcified annulus. There was trivial regurgitation. - Left atrium: The atrium was mildly dilated. - Tricuspid valve: There was mild regurgitation. - Inferior vena cava: The vessel was mildly dilated. Antimicrobials:  Anti-infectives (From admission, onward)   Start     Dose/Rate Route Frequency Ordered Stop   06/07/18 1400  piperacillin-tazobactam (ZOSYN) IVPB 3.375 g     3.375 g 12.5 mL/hr over 240 Minutes Intravenous Every 8 hours 06/07/18 0534     06/07/18 0400  piperacillin-tazobactam (ZOSYN) IVPB 3.375 g     3.375 g 12.5 mL/hr over 240 Minutes Intravenous  Once 06/07/18 0351 06/07/18 0836       Objective: Vitals:   06/10/18 1948 06/11/18 0447 06/11/18 0757 06/11/18 0825  BP: (!) 151/82 (!) 149/54 (!) 169/49 (!) 176/51  Pulse:  65 (!) 55  (!) 54  Resp: 18 (!) 23  16  Temp: 97.6 F (36.4 C) 97.6 F (36.4 C) 97.8 F (36.6 C)   TempSrc: Oral Oral Oral   SpO2: 98% 96%  98%  Weight:      Height:        Intake/Output Summary (Last 24 hours) at 06/11/2018 1432 Last data filed at 06/11/2018 1049 Gross per 24 hour  Intake 3810.75 ml  Output 1050 ml  Net 2760.75 ml   Filed Weights   06/07/18 2000  Weight: 63.7 kg    Examination: General exam: Appears comfortable  HEENT: PERRLA, oral mucosa moist, no sclera icterus or thrush Respiratory  system: Clear to auscultation. Respiratory effort normal. Cardiovascular system: S1 & S2 heard,  No murmurs  Gastrointestinal system: Abdomen soft, non-tender, nondistended. Normal bowel sound. No organomegaly Central nervous system: Alert and oriented. No focal neurological deficits. Extremities: No cyanosis, clubbing or edema Skin: No rashes or ulcers Psychiatry:  depressed/ crying    Data Reviewed: I have personally reviewed following labs and imaging studies  CBC: Recent Labs  Lab 06/06/18 2356 06/08/18 0331 06/09/18 0511  WBC 13.2* 6.7 7.7  HGB 10.4* 9.0* 9.3*  HCT 32.0* 28.8* 30.5*  MCV 92.8 95.7 97.4  PLT 438* 342 321   Basic Metabolic Panel: Recent Labs  Lab 06/06/18 2356 06/08/18 0331 06/08/18 0842 06/09/18 0511 06/10/18 0312 06/11/18 0359  NA 128* 138  --  138 138 138  K 4.1 3.6  --  3.2* 3.2* 3.6  CL 91* 105  --  108 108 109  CO2 25 25  --  24 23 23   GLUCOSE 208* 103*  --  151* 138* 135*  BUN 18 11  --  8 5* <5*  CREATININE 0.84 1.03*  --  0.99 0.92 1.00  CALCIUM 9.8 8.6*  --  8.3* 8.3* 8.1*  MG  --   --  1.8 1.8 1.7 2.1   GFR: Estimated Creatinine Clearance: 30.9 mL/min (by C-G formula based on SCr of 1 mg/dL). Liver Function Tests: Recent Labs  Lab 06/06/18 2356 06/09/18 0511  AST 14* 21  ALT 14 17  ALKPHOS 53 45  BILITOT 0.5 0.2*  PROT 7.4 5.7*  ALBUMIN 3.0* 2.1*   Recent Labs  Lab 06/06/18 2356  LIPASE 102*   No results for input(s): AMMONIA in the last 168 hours. Coagulation Profile: No results for input(s): INR, PROTIME in the last 168 hours. Cardiac Enzymes: Recent Labs  Lab 06/07/18 1513 06/08/18 0842  TROPONINI <0.03 <0.03   BNP (last 3 results) No results for input(s): PROBNP in the last 8760 hours. HbA1C: No results for input(s): HGBA1C in the last 72 hours. CBG: Recent Labs  Lab 06/10/18 0638 06/10/18 1126 06/10/18 1619 06/10/18 2142 06/11/18 0638  GLUCAP 140* 141* 153* 139* 112*   Lipid Profile: No results  for input(s): CHOL, HDL, LDLCALC, TRIG, CHOLHDL, LDLDIRECT in the last 72 hours. Thyroid Function Tests: No results for input(s): TSH, T4TOTAL, FREET4, T3FREE, THYROIDAB in the last 72 hours. Anemia Panel: No results for input(s): VITAMINB12, FOLATE, FERRITIN, TIBC, IRON, RETICCTPCT in the last 72 hours. Urine analysis:    Component Value Date/Time   COLORURINE YELLOW 06/07/2018 0000   APPEARANCEUR HAZY (A) 06/07/2018 0000   LABSPEC 1.014 06/07/2018 0000   PHURINE 5.0 06/07/2018 0000   GLUCOSEU 50 (A) 06/07/2018 0000   HGBUR NEGATIVE 06/07/2018 0000   BILIRUBINUR NEGATIVE 06/07/2018 0000   KETONESUR 5 (A) 06/07/2018 0000  PROTEINUR 30 (A) 06/07/2018 0000   NITRITE NEGATIVE 06/07/2018 0000   LEUKOCYTESUR MODERATE (A) 06/07/2018 0000   Sepsis Labs: @LABRCNTIP (procalcitonin:4,lacticidven:4) ) Recent Results (from the past 240 hour(s))  Urine Culture     Status: Abnormal   Collection Time: 06/07/18 12:00 AM  Result Value Ref Range Status   Specimen Description URINE, RANDOM  Final   Special Requests   Final    NONE Performed at [facility], [address] [place], [place] [address]    Culture >=100,000 COLONIES/mL ESCHERICHIA COLI (A)  Final   Report Status 06/09/2018 FINAL  Final   Organism ID, Bacteria ESCHERICHIA COLI (A)  Final      Susceptibility   Escherichia coli - MIC*    AMPICILLIN 4 SENSITIVE Sensitive     CEFAZOLIN <=4 SENSITIVE Sensitive     CEFTRIAXONE <=1 SENSITIVE Sensitive     CIPROFLOXACIN <=0.25 SENSITIVE Sensitive     GENTAMICIN <=1 SENSITIVE Sensitive     IMIPENEM <=0.25 SENSITIVE Sensitive     NITROFURANTOIN <=16 SENSITIVE Sensitive     TRIMETH/SULFA <=20 SENSITIVE Sensitive     AMPICILLIN/SULBACTAM <=2 SENSITIVE Sensitive     PIP/TAZO <=4 SENSITIVE Sensitive     Extended ESBL NEGATIVE Sensitive     * >=100,000 COLONIES/mL ESCHERICHIA COLI         Radiology Studies: Dg [facility]gi W [facility] & Delayed Images W Single Cm  Result Date:  06/11/2018 CLINICAL DATA:  85 year old female with perforated gastric ulcer noted on prior CT examination. Follow-up study. EXAM: WATER SOLUBLE UPPER GI SERIES TECHNIQUE: Single-column upper GI series was performed using water soluble contrast. CONTRAST:  150mL OMNIPAQUE IOHEXOL 300 MG/ML  SOLN COMPARISON:  Upper GI 06/07/2018. CT the abdomen and pelvis 06/07/2018. FLUOROSCOPY TIME:  Fluoroscopy Time:  1 minutes and 6 seconds Radiation Exposure Index (if provided by the fluoroscopic device): 11.6 mGy FINDINGS: Limited single contrast upper GI again demonstrates the presence of a perforated ulcer along the proximal aspect of the lesser curvature of the stomach which appears contained. No free-flowing contrast into the peritoneal cavity was identified. IMPRESSION: Similar appearance of contained perforation in the proximal aspect of the lesser curvature of the stomach, presumably from a perforated gastric ulcer. Electronically Signed   By: Trudie Reedaniel  Entrikin M.D.   On: 06/11/2018 11:43    CT abd/pelvis: IMPRESSION: 1. Fluid-filled gastric outpouching from the lesser curvature. There is significant surrounding inflammatory change. Findings are suspicious for early or contained gastric perforation, possible peptic ulcer disease, less likely gastric diverticulum which is inflamed. No extraluminal air. Recommend surgical consultation. 2. Chronic findings include gallstones and colonic diverticulosis without diverticulitis. Small hiatal hernia. 3. 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 This was described on outside prior exam. 4. Possible pelvic congestion. 5.  Aortic Atherosclerosis (ICD10-I70.0).  UGI:  IMPRESSION: 1. Deep gastric ulcer/contained perforation along the posterior wall of the stomach. 2. Prominent gastroesophageal reflux.    Scheduled Meds: . pantoprazole (PROTONIX) IV  40 mg Intravenous Q12H   Continuous Infusions: . dextrose 5 % and 0.9% NaCl 100 mL/hr  at 06/11/18 1419  . piperacillin-tazobactam (ZOSYN)  IV 3.375 g (06/11/18 0815)     LOS: 4 days      Calvert CantorSaima Jmarion Christiano, MD [facility] Pager: [email or web address] Password [email or web address] 06/11/2018, 2:32 PM

## 2018-06-12 LAB — BASIC METABOLIC PANEL
Anion gap: 6 (ref 5–15)
BUN: 5 mg/dL — ABNORMAL LOW (ref 8–23)
CO2: 22 mmol/L (ref 22–32)
CREATININE: 0.99 mg/dL (ref 0.44–1.00)
Calcium: 8.4 mg/dL — ABNORMAL LOW (ref 8.9–10.3)
Chloride: 112 mmol/L — ABNORMAL HIGH (ref 98–111)
GFR calc Af Amer: 57 mL/min — ABNORMAL LOW (ref >60)
GFR calc non Af Amer: 49 mL/min — ABNORMAL LOW (ref >60)
Glucose, Bld: 129 mg/dL — ABNORMAL HIGH (ref 70–99)
Potassium: 4.3 mmol/L (ref 3.5–5.1)
Sodium: 140 mmol/L (ref 135–145)

## 2018-06-12 LAB — GLUCOSE, CAPILLARY
Glucose-Capillary: 120 mg/dL — ABNORMAL HIGH (ref 70–99)
Glucose-Capillary: 137 mg/dL — ABNORMAL HIGH (ref 70–99)
Glucose-Capillary: 146 mg/dL — ABNORMAL HIGH (ref 70–99)
Glucose-Capillary: 111 mg/dL — ABNORMAL HIGH (ref 70–99)

## 2018-06-12 MED ORDER — BOOST / RESOURCE BREEZE PO LIQD CUSTOM
1.0000 | Freq: Two times a day (BID) | ORAL | Status: DC
  Administered 2018-06-12 – 2018-06-14 (×2): 1 via ORAL

## 2018-06-12 MED ORDER — ASPIRIN 325 MG PO TABS
325.0000 mg | ORAL_TABLET | Freq: Every day | ORAL | Status: DC
  Administered 2018-06-12 – 2018-06-14 (×3): 325 mg via ORAL
  Filled 2018-06-12 (×3): qty 1

## 2018-06-12 MED ORDER — AMLODIPINE BESYLATE 5 MG PO TABS
5.0000 mg | ORAL_TABLET | Freq: Every day | ORAL | Status: DC
  Administered 2018-06-12 – 2018-06-14 (×3): 5 mg via ORAL
  Filled 2018-06-12 (×3): qty 1

## 2018-06-12 NOTE — Progress Notes (Signed)
[facility] Progress Note     Subjective: CC-  Daughter at bedside. UCI yesterday showed similar appearance of contained perforation in the proximal aspect of the lesser curvature of the stomach.  Patient without complaints. Denies abdominal pain. Tolerating sips of clear liquids without increased pain. Denies n/v. Thinks she may have a BM today. Would like to consider a stent in rehab once discharged from the hospital.  Objective: Vital signs in last 24 hours: Temp:  [98 F (36.7 C)-98.1 F (36.7 C)] 98 F (36.7 C) (01/11 0405) Pulse Rate:  [61-68] 61 (01/11 0405) Resp:  [17-21] 17 (01/11 0405) BP: (138-182)/(38-56) 138/38 (01/11 0405) SpO2:  [95 %-99 %] 96 % (01/11 0405) Last BM Date: 06/11/18  Intake/Output from previous day: 01/10 0701 - 01/11 0700 In: 3608.5 [I.V.:3035.2; IV Piggyback:573.3] Out: 2725 [Urine:2725] Intake/Output this shift: No intake/output data recorded.  PE: Gen: Alert, NAD, pleasant HEENT: EOM's intact, pupils equal and round Card:Irregular, HR ~60bpm while I was in the room Pulm:effort normal Abd: Soft,ND, +BS, no HSM, NT, no rebound or guarding Skin: no rashes noted, warm and dry   Lab Results:  No results for input(s): WBC, HGB, HCT, PLT in the last 72 hours. BMET Recent Labs    06/11/18 0359 06/12/18 0313  NA 138 140  K 3.6 4.3  CL 109 112*  CO2 23 22  GLUCOSE 135* 129*  BUN <5* 5*  CREATININE 1.00 0.99  CALCIUM 8.1* 8.4*   PT/INR No results for input(s): LABPROT, INR in the last 72 hours. CMP     Component Value Date/Time   NA 140 06/12/2018 0313   K 4.3 06/12/2018 0313   CL 112 (H) 06/12/2018 0313   CO2 22 06/12/2018 0313   GLUCOSE 129 (H) 06/12/2018 0313   BUN 5 (L) 06/12/2018 0313   CREATININE 0.99 06/12/2018 0313   CALCIUM 8.4 (L) 06/12/2018 0313   PROT 5.7 (L) 06/09/2018 0511   ALBUMIN 2.1 (L) 06/09/2018 0511   AST 21 06/09/2018 0511   ALT 17 06/09/2018 0511   ALKPHOS 45 06/09/2018 0511    BILITOT 0.2 (L) 06/09/2018 0511   GFRNONAA 49 (L) 06/12/2018 0313   GFRAA 57 (L) 06/12/2018 0313   Lipase     Component Value Date/Time   LIPASE 102 (H) 06/06/2018 2356       Studies/Results: Dg [facility]elle [facility] & Delayed Images W Single Cm  Result Date: 06/11/2018 CLINICAL DATA:  85 year old female with perforated gastric ulcer noted on prior CT examination. Follow-up study. EXAM: WATER SOLUBLE UPPER GI SERIES TECHNIQUE: Single-column upper GI series was performed using water soluble contrast. CONTRAST:  OMNIPAQUE IOHEXOL 300 MG/ML  SOLN COMPARISON:  Upper GI 06/07/2018. CT the abdomen and pelvis 06/07/2018. FLUOROSCOPY TIME:  Fluoroscopy Time:  1 minutes and 6 seconds Radiation Exposure Index (if provided by the fluoroscopic device): 11.6 mGy FINDINGS: Limited single contrast upper GI again demonstrates the presence of a perforated ulcer along the proximal aspect of the lesser curvature of the stomach which appears contained. No free-flowing contrast into the peritoneal cavity was identified. IMPRESSION: Similar appearance of contained perforation in the proximal aspect of the lesser curvature of the stomach, presumably from a perforated gastric ulcer. Electronically Signed   By: Trudie Reed M.D.   On: 06/11/2018 11:43    Anti-infectives: Anti-infectives (From admission, onward)   Start     Dose/Rate Route Frequency Ordered Stop   06/07/18 1400  piperacillin-tazobactam (ZOSYN) IVPB 3.375 g  3.375 g 12.5 mL/hr over 240 Minutes Intravenous Every 8 hours 06/07/18 0534     06/07/18 0400  piperacillin-tazobactam (ZOSYN) IVPB 3.375 g     3.375 g 12.5 mL/hr over 240 Minutes Intravenous  Once 06/07/18 0351 06/07/18 0836       Assessment/Plan Hypertension Diabetes mellitus History of CVA Atrial fibrillation Bradycardia with atrial fibrillation/second-degree AV block- ECHO1/7 EF 60-65% Right knee pain - plain films show osteoarthritis with small effusion Code status  DNR  Deep gastric ulcer/contained perforation along the posterior wall of the stomach - seen on initial UGI 1/6 - high likelihood of malignancy given recent weight loss - per GIshe's not a candidate for EGD due to contained perf - repeat UGI 1/10 continues to show contained leak - per discussion with palliative, patient does not wish to consider any surgery, Mrs. Katrinka BlazingSmith is still agreeable to at home, treat the treatable, hospice services  ID -zosyn 1/6>> FEN -IVF, CLD, Boost Breeze VTE -SCDs Foley -wick Follow up -TBD  Plan-Ok for clear liquids and Boost Breeze. Continue abx and IV PPI BID. Continue PT/mobilize.    LOS: 5 days    Franne FortsBrooke A Meuth , [facility] 06/12/2018, 10:42 AM Pager: [phone] Mon 7:00 am -11:30 AM Tues-Fri 7:00 am-4:30 pm Sat-Sun 7:00 am-11:30 am

## 2018-06-12 NOTE — Progress Notes (Signed)
Patient HR fluctuating 38-40; CMT notified; Patient sleeping, A/O x 4, no complaint of pain, Changed electrodes; MD Notified

## 2018-06-12 NOTE — Progress Notes (Signed)
PROGRESS NOTE    Briana Martinez   IRJ:[id number]  DOB: 19-Aug-1921  DOA: 06/06/2018 PCP: Kendell Bane, MD   Brief Narrative:  Briana Martinez 85 y.o.femalewith medical history significant ofCVA, and DM type II; who presents with complaints of 2 weeks of epigastric abdominal pain, intermittent vomiting and loss of appetite along with weight loss. Further imaging shows a deep gastric ulcer with a contained perforation along the posterior wall of the stomach.  Gen surgery consulted.    Subjective: No complaints today. Still incontinent of stool.  Tolerating clears.   Assessment & Plan:   Principal Problem:   Gastric ulcer with contained perforation - see UGI and Ct abdomen results below -  management per gen surgery - patient opting for a less aggressive approach after discussion with palliative care - GI does not recommend EGD at this time - on PPI and Zosyn-     management per gen surgery- started on clears    Active Problems:   Leukocytosis - WBC 13.2- - ? Due to above- has normalized  Hypokalemia/ hyponatremia due to dehydration - continue to replace K- cont IVF  Diarrhea - likely due to antibiotics - follow- d/w Gen surgery to see if antibiotic can be changed now that she is tolerating orals    Asymptomatic bacteriuria  H/o HTN -on Amlodipine and Cozaar at home- BP slightly elevated- avoiding oral meds in setting of perforation- PRN Lopressor and Hydralazine ordered  - resume Norvasc now that she is able to take orals  H/o CVA - on ASA 325 and Pravastatin as outpt - resume Aspirin  A-fib, 2nd degree type 1 AVB, Vtach - she had 1 tele strip showing A-fib - noted to have bradycardia on the night of 1/6 with 2nd degree type 1 AVB while asleep (see strip in Epic) which resolved when she awakened - 2 D ECHO noted below did not show any significant abnormality - noted to have non sustatained V tach on monitor  On 1/9- replacing electrolytes - no further work up  for now-    Time spent in minutes: 30 min- discussed plan with gen surgery DVT prophylaxis: SCDs Code Status: DNR Family Communication:  Disposition Plan: per gen surgery-    Consultants:   gen surgery  Palliative care Procedures:  2 D ECHO Study Conclusions  - Left ventricle: The cavity size was normal. Wall thickness was   increased in a pattern of mild LVH. Systolic function was normal.   The estimated ejection fraction was in the range of 60% to 65%.   Wall motion was normal; there were no regional wall motion   abnormalities. Left ventricular diastolic function parameters   were normal. - Aortic valve: Trileaflet; mildly thickened, mildly calcified   leaflets. Valve area (VTI): 1.42 cm^2. Valve area (Vmax): 1.56   cm^2. Valve area (Vmean): 1.74 cm^2. - Mitral valve: Calcified annulus. There was trivial regurgitation. - Left atrium: The atrium was mildly dilated. - Tricuspid valve: There was mild regurgitation. - Inferior vena cava: The vessel was mildly dilated. Antimicrobials:  Anti-infectives (From admission, onward)   Start     Dose/Rate Route Frequency Ordered Stop   06/07/18 1400  piperacillin-tazobactam (ZOSYN) IVPB 3.375 g     3.375 g 12.5 mL/hr over 240 Minutes Intravenous Every 8 hours 06/07/18 0534     06/07/18 0400  piperacillin-tazobactam (ZOSYN) IVPB 3.375 g     3.375 g 12.5 mL/hr over 240 Minutes Intravenous  Once 06/07/18 0351 06/07/18 0836  Objective: Vitals:   06/11/18 1548 06/11/18 1958 06/12/18 0405 06/12/18 1149  BP: (!) 182/49 (!) 140/56 (!) 138/38 (!) 174/58  Pulse: 62 68 61   Resp: (!) 21 (!) 21 17   Temp: 98.1 F (36.7 C) 98 F (36.7 C) 98 F (36.7 C) 97.6 F (36.4 C)  TempSrc: Oral Oral Oral Oral  SpO2: 99% 95% 96%   Weight:      Height:        Intake/Output Summary (Last 24 hours) at 06/12/2018 1336 Last data filed at 06/12/2018 0700 Gross per 24 hour  Intake 3608.53 ml  Output 1675 ml  Net 1933.53 ml   Filed  Weights   06/07/18 2000  Weight: 63.7 kg    Examination: General exam: Appears comfortable  HEENT: PERRLA, oral mucosa moist, no sclera icterus or thrush Respiratory system: Clear to auscultation. Respiratory effort normal. Cardiovascular system: S1 & S2 heard,  No murmurs  Gastrointestinal system: Abdomen soft, non-tender, nondistended. Normal bowel sound. No organomegaly Central nervous system: Alert and oriented. No focal neurological deficits. Extremities: No cyanosis, clubbing or edema Skin: No rashes or ulcers Psychiatry:  Mood & affect appropriate.     Data Reviewed: I have personally reviewed following labs and imaging studies  CBC: Recent Labs  Lab 06/06/18 2356 06/08/18 0331 06/09/18 0511  WBC 13.2* 6.7 7.7  HGB 10.4* 9.0* 9.3*  HCT 32.0* 28.8* 30.5*  MCV 92.8 95.7 97.4  PLT 438* 342 321   Basic Metabolic Panel: Recent Labs  Lab 06/08/18 0331 06/08/18 0842 06/09/18 0511 06/10/18 0312 06/11/18 0359 06/12/18 0313  NA 138  --  138 138 138 140  K 3.6  --  3.2* 3.2* 3.6 4.3  CL 105  --  108 108 109 112*  CO2 25  --  24 23 23 22   GLUCOSE 103*  --  151* 138* 135* 129*  BUN 11  --  8 5* <5* 5*  CREATININE 1.03*  --  0.99 0.92 1.00 0.99  CALCIUM 8.6*  --  8.3* 8.3* 8.1* 8.4*  MG  --  1.8 1.8 1.7 2.1  --    GFR: Estimated Creatinine Clearance: 31.2 mL/min (by C-G formula based on SCr of 0.99 mg/dL). Liver Function Tests: Recent Labs  Lab 06/06/18 2356 06/09/18 0511  AST 14* 21  ALT 14 17  ALKPHOS 53 45  BILITOT 0.5 0.2*  PROT 7.4 5.7*  ALBUMIN 3.0* 2.1*   Recent Labs  Lab 06/06/18 2356  LIPASE 102*   No results for input(s): AMMONIA in the last 168 hours. Coagulation Profile: No results for input(s): INR, PROTIME in the last 168 hours. Cardiac Enzymes: Recent Labs  Lab 06/07/18 1513 06/08/18 0842  TROPONINI <0.03 <0.03   BNP (last 3 results) No results for input(s): PROBNP in the last 8760 hours. HbA1C: No results for input(s): HGBA1C  in the last 72 hours. CBG: Recent Labs  Lab 06/11/18 0638 06/11/18 1611 06/11/18 2109 06/12/18 0630 06/12/18 1147  GLUCAP 112* 114* 125* 120* 137*   Lipid Profile: No results for input(s): CHOL, HDL, LDLCALC, TRIG, CHOLHDL, LDLDIRECT in the last 72 hours. Thyroid Function Tests: No results for input(s): TSH, T4TOTAL, FREET4, T3FREE, THYROIDAB in the last 72 hours. Anemia Panel: No results for input(s): VITAMINB12, FOLATE, FERRITIN, TIBC, IRON, RETICCTPCT in the last 72 hours. Urine analysis:    Component Value Date/Time   COLORURINE YELLOW 06/07/2018 0000   APPEARANCEUR HAZY (A) 06/07/2018 0000   LABSPEC 1.014 06/07/2018 0000   PHURINE 5.0  06/07/2018 0000   GLUCOSEU 50 (A) 06/07/2018 0000   HGBUR NEGATIVE 06/07/2018 0000   BILIRUBINUR NEGATIVE 06/07/2018 0000   KETONESUR 5 (A) 06/07/2018 0000   PROTEINUR 30 (A) 06/07/2018 0000   NITRITE NEGATIVE 06/07/2018 0000   LEUKOCYTESUR MODERATE (A) 06/07/2018 0000   Sepsis Labs: @LABRCNTIP (procalcitonin:4,lacticidven:4) ) Recent Results (from the past 240 hour(s))  Urine Culture     Status: Abnormal   Collection Time: 06/07/18 12:00 AM  Result Value Ref Range Status   Specimen Description URINE, RANDOM  Final   Special Requests   Final    NONE Performed at [facility], [address] [place], [place] [address]    Culture >=100,000 COLONIES/mL ESCHERICHIA COLI (A)  Final   Report Status 06/09/2018 FINAL  Final   Organism ID, Bacteria ESCHERICHIA COLI (A)  Final      Susceptibility   Escherichia coli - MIC*    AMPICILLIN 4 SENSITIVE Sensitive     CEFAZOLIN <=4 SENSITIVE Sensitive     CEFTRIAXONE <=1 SENSITIVE Sensitive     CIPROFLOXACIN <=0.25 SENSITIVE Sensitive     GENTAMICIN <=1 SENSITIVE Sensitive     IMIPENEM <=0.25 SENSITIVE Sensitive     NITROFURANTOIN <=16 SENSITIVE Sensitive     TRIMETH/SULFA <=20 SENSITIVE Sensitive     AMPICILLIN/SULBACTAM <=2 SENSITIVE Sensitive     PIP/TAZO <=4 SENSITIVE Sensitive       Extended ESBL NEGATIVE Sensitive     * >=100,000 COLONIES/mL ESCHERICHIA COLI         Radiology Studies: Dg Ugi [facility] & Delayed Images W Single Cm  Result Date: 06/11/2018 CLINICAL DATA:  85 year old female with perforated gastric ulcer noted on prior CT examination. Follow-up study. EXAM: WATER SOLUBLE UPPER GI SERIES TECHNIQUE: Single-column upper GI series was performed using water soluble contrast. CONTRAST:  OMNIPAQUE IOHEXOL 300 MG/ML  SOLN COMPARISON:  Upper GI 06/07/2018. CT the abdomen and pelvis 06/07/2018. FLUOROSCOPY TIME:  Fluoroscopy Time:  1 minutes and 6 seconds Radiation Exposure Index (if provided by the fluoroscopic device): 11.6 mGy FINDINGS: Limited single contrast upper GI again demonstrates the presence of a perforated ulcer along the proximal aspect of the lesser curvature of the stomach which appears contained. No free-flowing contrast into the peritoneal cavity was identified. IMPRESSION: Similar appearance of contained perforation in the proximal aspect of the lesser curvature of the stomach, presumably from a perforated gastric ulcer. Electronically Signed   By: Trudie Reed M.D.   On: 06/11/2018 11:43    CT abd/pelvis: IMPRESSION: 1. Fluid-filled gastric outpouching from the lesser curvature. There is significant surrounding inflammatory change. Findings are suspicious for early or contained gastric perforation, possible peptic ulcer disease, less likely gastric diverticulum which is inflamed. No extraluminal air. Recommend surgical consultation. 2. Chronic findings include gallstones and colonic diverticulosis without diverticulitis. Small hiatal hernia. 3. 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 This was described on outside prior exam. 4. Possible pelvic congestion. 5.  Aortic Atherosclerosis (ICD10-I70.0).  UGI:  IMPRESSION: 1. Deep gastric ulcer/contained perforation along the posterior wall of the stomach. 2.  Prominent gastroesophageal reflux.    Scheduled Meds: . feeding supplement  1 Container Oral BID BM  . pantoprazole (PROTONIX) IV  40 mg Intravenous Q12H   Continuous Infusions: . dextrose 5 % and 0.9 % NaCl with KCl 40 mEq/L 100 mL/hr at 06/12/18 1206  . piperacillin-tazobactam (ZOSYN)  IV 3.375 g (06/12/18 0754)     LOS: 5 days  Calvert Cantor, MD [facility] Pager: [email or web address] Password [facility] 06/12/2018, 1:36 PM

## 2018-06-13 DIAGNOSIS — I441 Atrioventricular block, second degree: Secondary | ICD-10-CM

## 2018-06-13 LAB — GLUCOSE, CAPILLARY
Glucose-Capillary: 114 mg/dL — ABNORMAL HIGH (ref 70–99)
Glucose-Capillary: 160 mg/dL — ABNORMAL HIGH (ref 70–99)
Glucose-Capillary: 119 mg/dL — ABNORMAL HIGH (ref 70–99)

## 2018-06-13 MED ORDER — LOSARTAN POTASSIUM 50 MG PO TABS
50.0000 mg | ORAL_TABLET | Freq: Every day | ORAL | Status: DC
  Administered 2018-06-13 – 2018-06-14 (×2): 50 mg via ORAL
  Filled 2018-06-13 (×2): qty 1

## 2018-06-13 NOTE — Progress Notes (Signed)
Physical Therapy Treatment Patient Details Name: Briana Martinez MRN: [id number] DOB: 04/06/1922 Today's Date: 06/13/2018    History of Present Illness 85yo female c/o epigastric pain, malaise and weakness, CT concerning for possible perforation, diverticulum, or peptic ulcer. PMH DM, CVA, joint replacement, HOH     PT Comments    Patient seen for further assessment due to increasing fatigue and weakness. Patient does not feel she can manage at home. Patient does currently fatigue with basic tasks and requires hands on assist for safety. I do feel patient could benefit from short stent of rehabilitation. Recommendations updated to reflect ST SNF upon acute discharge.  Patient appreciative and receptive although states unwell overall weak feeling.  Follow Up Recommendations  SNF     Equipment Recommendations  3in1 (PT)    Recommendations for Other Services       Precautions / Restrictions Precautions Precautions: Fall Restrictions Weight Bearing Restrictions: No LLE Weight Bearing: Weight bearing as tolerated    Mobility  Bed Mobility               General bed mobility comments: received in chair  Transfers Overall transfer level: Needs assistance Equipment used: Rolling walker (2 wheeled) Transfers: Sit to/from Stand Sit to Stand: Min assist         General transfer comment: Assist to power up to standing, reports feeling weaker today than previous days  Ambulation/Gait Ambulation/Gait assistance: Min assist Gait Distance (Feet): 10 Feet Assistive device: Rolling walker (2 wheeled) Gait Pattern/deviations: Step-through pattern;Decreased step length - right;Decreased step length - left;Trunk flexed;Drifts right/left Gait velocity: decreased    General Gait Details: only able to tolerate 10 ft of in room ambulation before fatigue. Assist for stability   Stairs             Wheelchair Mobility    Modified Rankin (Stroke Patients Only)        Balance Overall balance assessment: Needs assistance;History of Falls Sitting-balance support: Bilateral upper extremity supported;Feet supported Sitting balance-Leahy Scale: Good Sitting balance - Comments: able to help don shoes at EOB    Standing balance support: During functional activity;No upper extremity supported Standing balance-Leahy Scale: Fair Standing balance comment: Continues to rely heavily on UE support                            Cognition Arousal/Alertness: Awake/alert Behavior During Therapy: WFL for tasks assessed/performed Overall Cognitive Status: Impaired/Different from baseline Area of Impairment: Memory;Awareness;Problem solving                     Memory: Decreased short-term memory     Awareness: Emergent Problem Solving: Difficulty sequencing;Requires verbal cues;Requires tactile cues        Exercises      General Comments        Pertinent Vitals/Pain Faces Pain Scale: No hurt Pain Location: R knee  Pain Descriptors / Indicators: Aching;Sore    Home Living                      Prior Function            PT Goals (current goals can now be found in the care plan section) Acute Rehab PT Goals Patient Stated Goal: go home PT Goal Formulation: With patient/family Time For Goal Achievement: 06/23/18 Potential to Achieve Goals: Good Progress towards PT goals: Not progressing toward goals - comment(feels weaker today)    Frequency  Min 3X/week      PT Plan Discharge plan needs to be updated(patient does not feel she can manage at home)    Co-evaluation              AM-PAC PT "6 Clicks" Mobility   Outcome Measure  Help needed turning from your back to your side while in a flat bed without using bedrails?: None Help needed moving from lying on your back to sitting on the side of a flat bed without using bedrails?: None Help needed moving to and from a bed to a chair (including a wheelchair)?: A  Little Help needed standing up from a chair using your arms (e.g., wheelchair or bedside chair)?: A Little Help needed to walk in hospital room?: A Little Help needed climbing 3-5 steps with a railing? : A Lot 6 Click Score: 19    End of Session Equipment Utilized During Treatment: Gait belt Activity Tolerance: Patient tolerated treatment well Patient left: in chair;with call bell/phone within reach;with chair alarm set Nurse Communication: Mobility status PT Visit Diagnosis: Unsteadiness on feet (R26.81);Muscle weakness (generalized) (M62.81);History of falling (Z91.81)     Time: 2409-7353 PT Time Calculation (min) (ACUTE ONLY): 16 min  Charges:  $Therapeutic Activity: 8-22 mins                     Charlotte Crumb, PT DPT  Board Certified Neurologic Specialist Acute Rehabilitation Services Pager [phone] Office [phone]    Fabio Asa 06/13/2018, 2:05 PM

## 2018-06-13 NOTE — Progress Notes (Signed)
[facility] Progress Note     Subjective: CC-  No new complaints. Patient tolerating clear liquids. Denies any abdominal pain. Denies n/v. States that she had 2 loose stools yesterday, no BM this AM.  Objective: Vital signs in last 24 hours: Temp:  [97.6 F (36.4 C)-97.9 F (36.6 C)] 97.9 F (36.6 C) (01/12 0544) Pulse Rate:  [62-70] 62 (01/12 0544) Resp:  [17] 17 (01/12 0544) BP: (153-174)/(52-58) 158/52 (01/12 0544) SpO2:  [96 %-100 %] 96 % (01/12 0544) Last BM Date: 06/11/18  Intake/Output from previous day: 01/11 0701 - 01/12 0700 In: 2366.4 [I.V.:1697.6; IV Piggyback:668.8] Out: 1550 [Urine:1550] Intake/Output this shift: No intake/output data recorded.  PE: Gen: Alert, NAD, pleasant HEENT: EOM's intact, pupils equal and round Pulm:effort normal Abd: Soft,ND, +BS, no HSM, NT, no rebound or guarding Skin: no rashes noted, warm and dry  Lab Results:  No results for input(s): WBC, HGB, HCT, PLT in the last 72 hours. BMET Recent Labs    06/11/18 0359 06/12/18 0313  NA 138 140  K 3.6 4.3  CL 109 112*  CO2 23 22  GLUCOSE 135* 129*  BUN <5* 5*  CREATININE 1.00 0.99  CALCIUM 8.1* 8.4*   PT/INR No results for input(s): LABPROT, INR in the last 72 hours. CMP     Component Value Date/Time   NA 140 06/12/2018 0313   K 4.3 06/12/2018 0313   CL 112 (H) 06/12/2018 0313   CO2 22 06/12/2018 0313   GLUCOSE 129 (H) 06/12/2018 0313   BUN 5 (L) 06/12/2018 0313   CREATININE 0.99 06/12/2018 0313   CALCIUM 8.4 (L) 06/12/2018 0313   PROT 5.7 (L) 06/09/2018 0511   ALBUMIN 2.1 (L) 06/09/2018 0511   AST 21 06/09/2018 0511   ALT 17 06/09/2018 0511   ALKPHOS 45 06/09/2018 0511   BILITOT 0.2 (L) 06/09/2018 0511   GFRNONAA 49 (L) 06/12/2018 0313   GFRAA 57 (L) 06/12/2018 0313   Lipase     Component Value Date/Time   LIPASE 102 (H) 06/06/2018 2356       Studies/Results: Dg [facility] & Delayed Images W Single Cm  Result Date:  06/11/2018 CLINICAL DATA:  85 year old female with perforated gastric ulcer noted on prior CT examination. Follow-up study. EXAM: WATER SOLUBLE UPPER GI SERIES TECHNIQUE: Single-column upper GI series was performed using water soluble contrast. CONTRAST:  OMNIPAQUE IOHEXOL 300 MG/ML  SOLN COMPARISON:  Upper GI 06/07/2018. CT the abdomen and pelvis 06/07/2018. FLUOROSCOPY TIME:  Fluoroscopy Time:  1 minutes and 6 seconds Radiation Exposure Index (if provided by the fluoroscopic device): 11.6 mGy FINDINGS: Limited single contrast upper GI again demonstrates the presence of a perforated ulcer along the proximal aspect of the lesser curvature of the stomach which appears contained. No free-flowing contrast into the peritoneal cavity was identified. IMPRESSION: Similar appearance of contained perforation in the proximal aspect of the lesser curvature of the stomach, presumably from a perforated gastric ulcer. Electronically Signed   By: Trudie Reed M.D.   On: 06/11/2018 11:43    Anti-infectives: Anti-infectives (From admission, onward)   Start     Dose/Rate Route Frequency Ordered Stop   06/07/18 1400  piperacillin-tazobactam (ZOSYN) IVPB 3.375 g     3.375 g 12.5 mL/hr over 240 Minutes Intravenous Every 8 hours 06/07/18 0534     06/07/18 0400  piperacillin-tazobactam (ZOSYN) IVPB 3.375 g     3.375 g 12.5 mL/hr over 240 Minutes Intravenous  Once 06/07/18 0351 06/07/18 0836  Assessment/Plan Hypertension Diabetes mellitus History of CVA Atrial fibrillation Bradycardia with atrial fibrillation/second-degree AV block- ECHO1/7 EF 60-65% Right knee pain -plain films show osteoarthritis with small effusion Code status DNR  Deep gastric ulcer/contained perforation along the posterior wall of the stomach - seen on initial UGI 1/6 - high likelihood of malignancy given recent weight loss - per GIshe's not a candidate for EGD due to contained perf - repeat UGI 1/10 continues to show  contained leak - per discussion with palliative, patient does not wish to consider any surgery, Mrs. Katrinka BlazingSmith is still agreeable to at home, treat the treatable, hospice services; may consider stent in rehab prior to going home  ID -zosyn 1/6>>day#7 FEN -IVF, FLD, Boost  VTE -SCDs Foley -wick Follow up -TBD  Plan-Advance to full liquids. Continue Boost. Continue IV abx and IV PPI BID. PT/mobilize.    LOS: 6 days    Franne FortsBrooke A  , [facility] 06/13/2018, 8:43 AM Pager: [phone] Mon 7:00 am -11:30 AM Tues-Fri 7:00 am-4:30 pm Sat-Sun 7:00 am-11:30 am

## 2018-06-13 NOTE — Consult Note (Signed)
Cardiology Consultation:   Patient ID: Briana Martinez MRN: [id number]; DOB: [birth year]  Admit date: 06/06/2018 Date of Consult: 06/13/2018  Primary Care Provider: Kendell Bane, MD Primary Cardiologist: No primary care provider on file.    Patient Profile:   Briana Martinez is a 85 y.o. female with a hx of hypertension, diabetes and prior stroke who is being seen today for the evaluation of atrial fibrillation and bradycardia at the request of Dr. Butler Denmark.  History of Present Illness:   Briana Martinez was admitted 06/07/18 with two weeks of epigastric abdominal pain.  She presented with nausea and emesis.  CT was concerning for a gastric perforation.  She was started on a PPI.  The perforation was contained and per surgery, thought likely malignant in etiology.  This is being managed conservatively and she is now tolerating oral medications.  She has been evaluated by palliative care and will not be pursing surgery.  She will receive hospice services.  UGI on 1/10 continued to show a contained leak.  On admission she was reportedly in atrial fibrillation.  Her heart rate was controlled and she was asymptomatic.  She has no prior known history of atrial fibrillation.  On the evening of 1/6 while sleeping her heart rate was in the 30s.  There were episodes of Mobitz I.  She was awakened and asymptomatic.  Her heart rate increased to the 40-60s when awakened.  Echo revealed LVEF 60-65% with normal diastolic function.  On 1/9 she had an episode of NSVT.  Per nursing notes her heart rate was again in the 30s-40s when sleeping on 1/11.  Patient remained asymptomatic and cardiology was consulted.  She walked with PT today and felt well.  She notes that for the last week she has mild dizziness upon standing but denies syncope or pre-syncope.  She has fallen 4 times in the last year but attributes these falls to mechanical slips, not lightheadedness.    Past Medical History:  Diagnosis Date  . Abdominal pain  06/2018  . AVB (atrioventricular block)   . Cataract   . Complication of anesthesia    " The doctor told me not to be put tp sleep again, I dont know why "  . Diabetes mellitus without complication (HCC)   . Stroke [facility])     Past Surgical History:  Procedure Laterality Date  . ABDOMINAL HYSTERECTOMY    . CATARACT EXTRACTION, BILATERAL    . JOINT REPLACEMENT       Home Medications:  Prior to Admission medications   Medication Sig Start Date End Date Taking? Authorizing Provider  amLODipine (NORVASC) 5 MG tablet Take 5 mg by mouth daily.   Yes [provider]  aspirin 325 MG tablet Take 325 mg by mouth daily.   Yes [provider]  cholecalciferol (VITAMIN D3) 25 MCG (1000 UT) tablet Take 2,000 Units by mouth daily.   Yes [provider]  losartan (COZAAR) 50 MG tablet Take 50 mg by mouth daily.   Yes [provider]  Multiple Vitamin (MULTI-VITAMIN PO) Take 1 tablet by mouth daily. Reported on 05/28/2015   Yes [provider]  Multiple Vitamins-Minerals (PRESERVISION AREDS PO) Take by mouth 2 (two) times daily. Reported on 05/28/2015   Yes [provider]  pravastatin (PRAVACHOL) 20 MG tablet Take 20 mg by mouth daily.   Yes [provider]  traMADol (ULTRAM) 50 MG tablet Take 1 tablet (50 mg total) by mouth every 6 (six) hours as needed.  Patient taking differently: Take 50 mg by mouth every 6 (six) hours as needed for moderate pain.  05/28/15  Yes Carmelina DaneAnderson, Jeffery S, MD    Inpatient Medications: Scheduled Meds: . amLODipine  5 mg Oral Daily  . aspirin  325 mg Oral Daily  . feeding supplement  1 Container Oral BID BM  . pantoprazole (PROTONIX) IV  40 mg Intravenous Q12H   Continuous Infusions: . dextrose 5 % and 0.9 % NaCl with KCl 40 mEq/L 100 mL/hr at 06/12/18 1206  . piperacillin-tazobactam (ZOSYN)  IV 3.375 g (06/13/18 0915)   PRN Meds: acetaminophen **OR** acetaminophen, albuterol, hydrALAZINE, metoprolol  tartrate, ondansetron **OR** ondansetron (ZOFRAN) IV  Allergies:   No Known Allergies  Social History:   Social History   Socioeconomic History  . Marital status: Widowed    Spouse name: Not on file  . Number of children: Not on file  . Years of education: Not on file  . Highest education level: Not on file  Occupational History  . Not on file  Social Needs  . Financial resource strain: Not on file  . Food insecurity:    Worry: Not on file    Inability: Not on file  . Transportation needs:    Medical: Not on file    Non-medical: Not on file  Tobacco Use  . Smoking status: Never Smoker  . Smokeless tobacco: Never Used  Substance and Sexual Activity  . Alcohol use: Not Currently  . Drug use: Never  . Sexual activity: Not on file  Lifestyle  . Physical activity:    Days per week: Not on file    Minutes per session: Not on file  . Stress: Not on file  Relationships  . Social connections:    Talks on phone: Not on file    Gets together: Not on file    Attends religious service: Not on file    Active member of club or organization: Not on file    Attends meetings of clubs or organizations: Not on file    Relationship status: Not on file  . Intimate partner violence:    Fear of current or ex partner: Not on file    Emotionally abused: Not on file    Physically abused: Not on file    Forced sexual activity: Not on file  Other Topics Concern  . Not on file  Social History Narrative  . Not on file    Family History:   History reviewed. No pertinent family history.   ROS:  Please see the history of present illness.   All other ROS reviewed and negative.     Physical Exam/Data:   Vitals:   06/12/18 1149 06/12/18 2011 06/13/18 0544 06/13/18 0910  BP: (!) 174/58 (!) 153/54 (!) 158/52 (!) 148/52  Pulse:  70 62   Resp:  17 17   Temp: 97.6 F (36.4 C) 97.9 F (36.6 C) 97.9 F (36.6 C)   TempSrc: Oral Oral Oral   SpO2:  100% 96%   Weight:      Height:         Intake/Output Summary (Last 24 hours) at 06/13/2018 1242 Last data filed at 06/13/2018 0700 Gross per 24 hour  Intake 1766.35 ml  Output 1550 ml  Net 216.35 ml   Last 3 Weights 06/07/2018 05/28/2015 03/29/2013  Weight (lbs) 140 lb 6.9 oz 163 lb 3.2 oz 172 lb  Weight (kg) 63.7 kg 74.027 kg 78.019 kg     VS:  BP Marland Kitchen(!)  155/61 (BP Location: Left Arm)   Pulse 71   Temp 97.7 F (36.5 C) (Oral)   Resp (!) 22   Ht 5\' 3"  (1.6 m)   Wt 63.7 kg   SpO2 99%   BMI 24.88 kg/m  , BMI Body mass index is 24.88 kg/m. GENERAL:  Well appearing HEENT: Pupils equal round and reactive, fundi not visualized, oral mucosa unremarkable NECK:  No jugular venous distention, waveform within normal limits, carotid upstroke brisk and symmetric, no bruits, no thyromegaly LYMPHATICS:  No cervical adenopathy LUNGS:  Clear to auscultation bilaterally HEART:  RRR.  PMI not displaced or sustained,S1 and S2 within normal limits, no S3, no S4, no clicks, no rubs, no murmurs ABD:  Flat, positive bowel sounds normal in frequency in pitch, no bruits, no rebound, no guarding, no midline pulsatile mass, no hepatomegaly, no splenomegaly EXT:  2 plus pulses throughout, no edema, no cyanosis no clubbing SKIN:  No rashes no nodules NEURO:  Cranial nerves II through XII grossly intact, motor grossly intact throughout PSYCH:  Cognitively intact, oriented to person place and time   EKG:  The EKG was personally reviewed and demonstrates:   06/13/18: Second degree heart block.  Mobitz I.  Rate 38 bpm.   06/06/18: Mobitz I.  Rate 80 bpm.  LVH.  (not atrial fibrillation).  Telemetry:  Telemetry was personally reviewed and demonstrates:  Sinus rhythm, sinus bradycardia with Mobitz I.  No atrial fibrillation.  Relevant CV Studies:  Echo 06/08/18: Study Conclusions  - Left ventricle: The cavity size was normal. Wall thickness was   increased in a pattern of mild LVH. Systolic function was normal.   The estimated ejection fraction  was in the range of 60% to 65%.   Wall motion was normal; there were no regional wall motion   abnormalities. Left ventricular diastolic function parameters   were normal. - Aortic valve: Trileaflet; mildly thickened, mildly calcified   leaflets. Valve area (VTI): 1.42 cm^2. Valve area (Vmax): 1.56   cm^2. Valve area (Vmean): 1.74 cm^2. - Mitral valve: Calcified annulus. There was trivial regurgitation. - Left atrium: The atrium was mildly dilated. - Tricuspid valve: There was mild regurgitation. - Inferior vena cava: The vessel was mildly dilated.  Laboratory Data:  Chemistry Recent Labs  Lab 06/10/18 0312 06/11/18 0359 06/12/18 0313  NA 138 138 140  K 3.2* 3.6 4.3  CL 108 109 112*  CO2 23 23 22   GLUCOSE 138* 135* 129*  BUN 5* <5* 5*  CREATININE 0.92 1.00 0.99  CALCIUM 8.3* 8.1* 8.4*  GFRNONAA 53* 48* 49*  GFRAA >60 56* 57*  ANIONGAP 7 6 6     Recent Labs  Lab 06/06/18 2356 06/09/18 0511  PROT 7.4 5.7*  ALBUMIN 3.0* 2.1*  AST 14* 21  ALT 14 17  ALKPHOS 53 45  BILITOT 0.5 0.2*   Hematology Recent Labs  Lab 06/06/18 2356 06/08/18 0331 06/09/18 0511  WBC 13.2* 6.7 7.7  RBC 3.45* 3.01* 3.13*  HGB 10.4* 9.0* 9.3*  HCT 32.0* 28.8* 30.5*  MCV 92.8 95.7 97.4  MCH 30.1 29.9 29.7  MCHC 32.5 31.3 30.5  RDW 12.6 12.9 12.9  PLT 438* 342 321   Cardiac Enzymes Recent Labs  Lab 06/07/18 1513 06/08/18 0842  TROPONINI <0.03 <0.03    Recent Labs  Lab 06/07/18 0000  TROPIPOC 0.00    BNPNo results for input(s): BNP, PROBNP in the last 168 hours.  DDimer No results for input(s): DDIMER in the last 168 hours.  Radiology/Studies:  Dg Roselle Locus Scout & Delayed Images W Single Cm  Result Date: 06/11/2018 CLINICAL DATA:  85 year old female with perforated gastric ulcer noted on prior CT examination. Follow-up study. EXAM: WATER SOLUBLE UPPER GI SERIES TECHNIQUE: Single-column upper GI series was performed using water soluble contrast. CONTRAST:  OMNIPAQUE IOHEXOL  300 MG/ML  SOLN COMPARISON:  Upper GI 06/07/2018. CT the abdomen and pelvis 06/07/2018. FLUOROSCOPY TIME:  Fluoroscopy Time:  1 minutes and 6 seconds Radiation Exposure Index (if provided by the fluoroscopic device): 11.6 mGy FINDINGS: Limited single contrast upper GI again demonstrates the presence of a perforated ulcer along the proximal aspect of the lesser curvature of the stomach which appears contained. No free-flowing contrast into the peritoneal cavity was identified. IMPRESSION: Similar appearance of contained perforation in the proximal aspect of the lesser curvature of the stomach, presumably from a perforated gastric ulcer. Electronically Signed   By: Trudie Reed M.D.   On: 06/11/2018 11:43    Assessment and Plan:   # Bradycardia: # Second degree heart block: # Mobitz I: Ms. Brunner has multiple episodes of bradycardia and Mobitz I second degree AV block.  The most significant episodes occur when sleeping and she is asymptomatic.  She does have mild dizziness upon standing, though it isn't clear that this is attributable to bradycardia.  Given her perforated gastric ulcer she is at high risk of infection if a PPM were to be implanted.  Given that she is relatively asymptomatic, has Mobitz I and and a narrow QRS, would continue to monitor for now.  Echo showed normal systolic function.   Continue to avoid nodal agents.  Of note, her initial EKGs also showed Mobitz I.  There has been no evidence of atrial fibrillation.  Therefore, would not anticoagulate her.       For questions or updates, please contact [facility] Please consult [email or web address] for contact info under     Signed, Chilton Si, MD  06/13/2018 12:42 PM

## 2018-06-13 NOTE — Progress Notes (Addendum)
PROGRESS NOTE    Briana Martinez   ZOX:[id number]  DOB: [birth year]  DOA: 06/06/2018 PCP: Kendell BaneBell, William, MD   Brief Narrative:  Briana Martinez 85 y.o.femalewith medical history significant ofCVA, and DM type II; who presents with complaints of 2 weeks of epigastric abdominal pain, intermittent vomiting and loss of appetite along with weight loss. Further imaging shows a deep gastric ulcer with a contained perforation along the posterior wall of the stomach.  Gen surgery consulted.    Subjective: No complaints- happy to be drinking clear liquids.  Assessment & Plan:   Principal Problem:   Gastric ulcer with contained perforation - see UGI and Ct abdomen results below - patient opting for a less aggressive approach after discussion with palliative care and does not want surgery - GI does not recommend EGD at this time - on PPI and Zosyn-     management per gen surgery> advance to full liquids today- d/c antibiotics   - I will d/c IVF today  Active Problems:   Leukocytosis - WBC 13.2- - ? Due to above- has normalized  Mobitz type 2 heart block &  Vtach - 2 D ECHO noted below did not show any significant abnormality - noted to have non sustatained V tach on monitor on 1/9- replacing electrolytes- has not recurred - appreciate cardiology eval- she appears to be asymptomatic at this time in regards to her heart block-  no need for pacemaker at this time   Hypokalemia/ hyponatremia due to dehydration - replaced  Diarrhea - likely due to antibiotics - follow- d/w Gen surgery to see if antibiotic can be changed now that she is tolerating orals- gen surgery recommends to d/c antibiotics today - follow diarrhea    Asymptomatic  - no symptoms of a UTI  H/o HTN -on Amlodipine and Cozaar at home- BP was slightly elevated- avoiding oral meds in setting of perforation- PRN Lopressor and Hydralazine ordered  -1/11>  resumed Norvasc - resume Cozaar today  H/o CVA - on ASA 325  and Pravastatin as outpt - resumed Aspirin  Debilitated - PT recommends SNF- will consult SW  Disposition: per palliative care discussion, DNR, initial decision was to d/c to home with hospice but she is now wanting to go to rehab first- SW consulted   Time spent in minutes: 30 min- discussed plan with cardiology, patient and daughter today DVT prophylaxis: SCDs Code Status: DNR Family Communication: daughter, [name] Disposition Plan: per gen surgery-    Consultants:   gen surgery  Palliative care  cardiology Procedures:  2 D ECHO Study Conclusions  - Left ventricle: The cavity size was normal. Wall thickness was   increased in a pattern of mild LVH. Systolic function was normal.   The estimated ejection fraction was in the range of 60% to 65%.   Wall motion was normal; there were no regional wall motion   abnormalities. Left ventricular diastolic function parameters   were normal. - Aortic valve: Trileaflet; mildly thickened, mildly calcified   leaflets. Valve area (VTI): 1.42 cm^2. Valve area (Vmax): 1.56   cm^2. Valve area (Vmean): 1.74 cm^2. - Mitral valve: Calcified annulus. There was trivial regurgitation. - Left atrium: The atrium was mildly dilated. - Tricuspid valve: There was mild regurgitation. - Inferior vena cava: The vessel was mildly dilated. Antimicrobials:  Anti-infectives (From admission, onward)   Start     Dose/Rate Route Frequency Ordered Stop   06/07/18 1400  piperacillin-tazobactam (ZOSYN) IVPB 3.375 g  Status:  Discontinued  3.375 g 12.5 mL/hr over 240 Minutes Intravenous Every 8 hours 06/07/18 0534 06/13/18 1328   06/07/18 0400  piperacillin-tazobactam (ZOSYN) IVPB 3.375 g     3.375 g 12.5 mL/hr over 240 Minutes Intravenous  Once 06/07/18 0351 06/07/18 0836       Objective: Vitals:   06/12/18 2011 06/13/18 0544 06/13/18 0910 06/13/18 1256  BP: (!) 153/54 (!) 158/52 (!) 148/52 (!) 155/61  Pulse: 70 62  71  Resp: 17 17  (!) 22  Temp:  97.9 F (36.6 C) 97.9 F (36.6 C)  97.7 F (36.5 C)  TempSrc: Oral Oral  Oral  SpO2: 100% 96%  99%  Weight:      Height:        Intake/Output Summary (Last 24 hours) at 06/13/2018 1409 Last data filed at 06/13/2018 1257 Gross per 24 hour  Intake 2006.35 ml  Output 2250 ml  Net -243.65 ml   Filed Weights   06/07/18 2000  Weight: 63.7 kg    Examination: General exam: Appears comfortable  HEENT: PERRLA, oral mucosa moist, no sclera icterus or thrush Respiratory system: Clear to auscultation. Respiratory effort normal. Cardiovascular system: S1 & S2 heard,  No murmurs  Gastrointestinal system: Abdomen soft, non-tender, nondistended. Normal bowel sound. No organomegaly Central nervous system: Alert and oriented. No focal neurological deficits. Extremities: No cyanosis, clubbing or edema Skin: No rashes or ulcers Psychiatry:  Mood & affect appropriate.    Data Reviewed: I have personally reviewed following labs and imaging studies  CBC: Recent Labs  Lab 06/06/18 2356 06/08/18 0331 06/09/18 0511  WBC 13.2* 6.7 7.7  HGB 10.4* 9.0* 9.3*  HCT 32.0* 28.8* 30.5*  MCV 92.8 95.7 97.4  PLT 438* 342 321   Basic Metabolic Panel: Recent Labs  Lab 06/08/18 0331 06/08/18 0842 06/09/18 0511 06/10/18 0312 06/11/18 0359 06/12/18 0313  NA 138  --  138 138 138 140  K 3.6  --  3.2* 3.2* 3.6 4.3  CL 105  --  108 108 109 112*  CO2 25  --  24 23 23 22   GLUCOSE 103*  --  151* 138* 135* 129*  BUN 11  --  8 5* <5* 5*  CREATININE 1.03*  --  0.99 0.92 1.00 0.99  CALCIUM 8.6*  --  8.3* 8.3* 8.1* 8.4*  MG  --  1.8 1.8 1.7 2.1  --    GFR: Estimated Creatinine Clearance: 31.2 mL/min (by C-G formula based on SCr of 0.99 mg/dL). Liver Function Tests: Recent Labs  Lab 06/06/18 2356 06/09/18 0511  AST 14* 21  ALT 14 17  ALKPHOS 53 45  BILITOT 0.5 0.2*  PROT 7.4 5.7*  ALBUMIN 3.0* 2.1*   Recent Labs  Lab 06/06/18 2356  LIPASE 102*   No results for input(s): AMMONIA in the  last 168 hours. Coagulation Profile: No results for input(s): INR, PROTIME in the last 168 hours. Cardiac Enzymes: Recent Labs  Lab 06/07/18 1513 06/08/18 0842  TROPONINI <0.03 <0.03   BNP (last 3 results) No results for input(s): PROBNP in the last 8760 hours. HbA1C: No results for input(s): HGBA1C in the last 72 hours. CBG: Recent Labs  Lab 06/12/18 1147 06/12/18 1630 06/12/18 2135 06/13/18 0654 06/13/18 1233  GLUCAP 137* 146* 111* 114* 160*   Lipid Profile: No results for input(s): CHOL, HDL, LDLCALC, TRIG, CHOLHDL, LDLDIRECT in the last 72 hours. Thyroid Function Tests: No results for input(s): TSH, T4TOTAL, FREET4, T3FREE, THYROIDAB in the last 72 hours. Anemia Panel: No results  for input(s): VITAMINB12, FOLATE, FERRITIN, TIBC, IRON, RETICCTPCT in the last 72 hours. Urine analysis:    Component Value Date/Time   COLORURINE YELLOW 06/07/2018 0000   APPEARANCEUR HAZY (A) 06/07/2018 0000   LABSPEC 1.014 06/07/2018 0000   PHURINE 5.0 06/07/2018 0000   GLUCOSEU 50 (A) 06/07/2018 0000   HGBUR NEGATIVE 06/07/2018 0000   BILIRUBINUR NEGATIVE 06/07/2018 0000   KETONESUR 5 (A) 06/07/2018 0000   PROTEINUR 30 (A) 06/07/2018 0000   NITRITE NEGATIVE 06/07/2018 0000   LEUKOCYTESUR MODERATE (A) 06/07/2018 0000   Sepsis Labs: @LABRCNTIP (procalcitonin:4,lacticidven:4) ) Recent Results (from the past 240 hour(s))  Urine Culture     Status: Abnormal   Collection Time: 06/07/18 12:00 AM  Result Value Ref Range Status   Specimen Description URINE, RANDOM  Final   Special Requests   Final    NONE Performed at [facility], [address] [place], [place] [address]    Culture >=100,000 COLONIES/mL ESCHERICHIA COLI (A)  Final   Report Status 06/09/2018 FINAL  Final   Organism ID, Bacteria ESCHERICHIA COLI (A)  Final      Susceptibility   Escherichia coli - MIC*    AMPICILLIN 4 SENSITIVE Sensitive     CEFAZOLIN <=4 SENSITIVE Sensitive     CEFTRIAXONE <=1 SENSITIVE  Sensitive     CIPROFLOXACIN <=0.25 SENSITIVE Sensitive     GENTAMICIN <=1 SENSITIVE Sensitive     IMIPENEM <=0.25 SENSITIVE Sensitive     NITROFURANTOIN <=16 SENSITIVE Sensitive     TRIMETH/SULFA <=20 SENSITIVE Sensitive     AMPICILLIN/SULBACTAM <=2 SENSITIVE Sensitive     PIP/TAZO <=4 SENSITIVE Sensitive     Extended ESBL NEGATIVE Sensitive     * >=100,000 COLONIES/mL ESCHERICHIA COLI         Radiology Studies: No results found.  CT abd/pelvis: IMPRESSION: 1. Fluid-filled gastric outpouching from the lesser curvature. There is significant surrounding inflammatory change. Findings are suspicious for early or contained gastric perforation, possible peptic ulcer disease, less likely gastric diverticulum which is inflamed. No extraluminal air. Recommend surgical consultation. 2. Chronic findings include gallstones and colonic diverticulosis without diverticulitis. Small hiatal hernia. 3. 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 This was described on outside prior exam. 4. Possible pelvic congestion. 5.  Aortic Atherosclerosis (ICD10-I70.0).  UGI:  IMPRESSION: 1. Deep gastric ulcer/contained perforation along the posterior wall of the stomach. 2. Prominent gastroesophageal reflux.    Scheduled Meds: . amLODipine  5 mg Oral Daily  . aspirin  325 mg Oral Daily  . feeding supplement  1 Container Oral BID BM  . pantoprazole (PROTONIX) IV  40 mg Intravenous Q12H   Continuous Infusions:    LOS: 6 days      Calvert CantorSaima Matheus Spiker, MD [facility] Pager: [email or web address] Password [email or web address] 06/13/2018, 2:09 PM

## 2018-06-13 NOTE — Progress Notes (Signed)
Received order for MD - EKG EKG completed and filed - MD notified

## 2018-06-14 DIAGNOSIS — K631 Perforation of intestine (nontraumatic): Secondary | ICD-10-CM

## 2018-06-14 DIAGNOSIS — I4729 Other ventricular tachycardia: Secondary | ICD-10-CM

## 2018-06-14 DIAGNOSIS — I441 Atrioventricular block, second degree: Secondary | ICD-10-CM

## 2018-06-14 DIAGNOSIS — R198 Other specified symptoms and signs involving the digestive system and abdomen: Secondary | ICD-10-CM

## 2018-06-14 DIAGNOSIS — I10 Essential (primary) hypertension: Secondary | ICD-10-CM

## 2018-06-14 DIAGNOSIS — Z7189 Other specified counseling: Secondary | ICD-10-CM

## 2018-06-14 DIAGNOSIS — Z8673 Personal history of transient ischemic attack (TIA), and cerebral infarction without residual deficits: Secondary | ICD-10-CM

## 2018-06-14 DIAGNOSIS — I472 Ventricular tachycardia: Secondary | ICD-10-CM

## 2018-06-14 LAB — BASIC METABOLIC PANEL
Anion gap: 7 (ref 5–15)
BUN: 7 mg/dL — ABNORMAL LOW (ref 8–23)
CO2: 23 mmol/L (ref 22–32)
Calcium: 8.5 mg/dL — ABNORMAL LOW (ref 8.9–10.3)
Chloride: 107 mmol/L (ref 98–111)
Creatinine, Ser: 0.98 mg/dL (ref 0.44–1.00)
GFR calc Af Amer: 57 mL/min — ABNORMAL LOW (ref >60)
GFR calc non Af Amer: 49 mL/min — ABNORMAL LOW (ref >60)
Glucose, Bld: 99 mg/dL (ref 70–99)
Potassium: 4 mmol/L (ref 3.5–5.1)
Sodium: 137 mmol/L (ref 135–145)

## 2018-06-14 MED ORDER — PANTOPRAZOLE SODIUM 40 MG PO TBEC
40.0000 mg | DELAYED_RELEASE_TABLET | Freq: Two times a day (BID) | ORAL | Status: DC
  Administered 2018-06-14: 40 mg via ORAL
  Filled 2018-06-14: qty 1

## 2018-06-14 MED ORDER — PANTOPRAZOLE SODIUM 40 MG PO TBEC: 40.0000 mg | DELAYED_RELEASE_TABLET | Freq: Two times a day (BID) | ORAL | Status: DC

## 2018-06-14 MED ORDER — ACETAMINOPHEN 325 MG PO TABS: 650.0000 mg | ORAL_TABLET | Freq: Four times a day (QID) | ORAL | Status: AC | PRN

## 2018-06-14 NOTE — Progress Notes (Addendum)
Patient will DC to: [facility]  DC Date: 06/14/2018 Family Notified:Linda Manson Passey  Transport By: Serita Grit- her daughter, Alger Memos  RN, patient, and facility notified of DC. Discharge Summary sent to facility. RN given number for report. DC packet on [phone]. Patient will be transported by her daughter Alger Memos.   Clinical Social Worker signing off.  Antony Blackbird, [facility] Clinical Social Worker [phone]
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
## 2018-06-14 NOTE — Clinical Social Work Note (Signed)
Clinical Social Work Assessment  Patient Details  Name: Briana Martinez MRN: [id number] Date of Birth: May 14, 1922  Date of referral:  06/14/18               Reason for consult:  Facility Placement                Permission sought to share information with:  Facility Medical sales representative, Family Supports Permission granted to share information::  Yes, Verbal Permission Granted  Name::     Warehouse manager::  SNFs  Relationship::  daughter  Contact Information:  [phone]  Housing/Transportation Living arrangements for the past 2 months:  Single Family Home Source of Information:  Adult Children Patient Interpreter Needed:  None Criminal Activity/Legal Involvement Pertinent to Current Situation/Hospitalization:  No - Comment as needed Significant Relationships:  Adult Children Lives with:  Self Do you feel safe going back to the place where you live?  No Need for family participation in patient care:  Yes (Comment)  Care giving concerns:  CSW received consult for discharge needs. CSW spoke with patient's daughter, Bonita Quin, regarding PT recommendation of SNF placement at time of discharge. Patient's daughter states she lives alone in a single story home.   Social Worker assessment / plan:  CSW spoke with patient's daughter  concerning possibility of rehab at [facility] before returning home. Patient's daughter agrees with PT recommendations of SNF placement.   Employment status:  Other (Comment) Insurance information:  Medicare PT Recommendations:  Skilled Nursing Facility Information / Referral to community resources:  Skilled Nursing Facility  Patient/Family's Response to care: Patient's family recognizes need for rehab before returning home and is agreeable to a SNF placement. Patient's daughter reported preference for [facility] because she has in-laws near the facility that assist checking in on the patient.    Patient/Family's Understanding of and Emotional Response to  Diagnosis, Current Treatment, and Prognosis:  Patient's family is realistic regarding therapy needs and expressed being hopeful for SNF placement. Family expressed understanding of CSW role and discharge process as well as medical condition. No questions/concerns about plan or treatment at this time.   Emotional Assessment Appearance:  Appears stated age Attitude/Demeanor/Rapport:  Other(cooperative) Affect (typically observed):  Accepting, Appropriate Orientation:  Oriented to Self, Oriented to Place, Oriented to  Time, Oriented to Situation Alcohol / Substance use:  Not Applicable Psych involvement (Current and /or in the community):  No (Comment)  Discharge Needs  Concerns to be addressed:  Care Coordination, Basic Needs Readmission within the last 30 days:  No Current discharge risk:  Dependent with Mobility, Lives alone Barriers to Discharge:  Continued Medical Work up   [employer], LCSWA 06/14/2018, 1:59 PM

## 2018-06-14 NOTE — Clinical Social Work Placement (Signed)
   CLINICAL SOCIAL WORK PLACEMENT  NOTE  Date:  06/14/2018  Patient Details  Name: Briana Martinez MRN: [id number] Date of Birth: 1921/07/19  Clinical Social Work is seeking post-discharge placement for this patient at the Skilled  Nursing Facility level of care (*CSW will initial, date and re-position this form in  chart as items are completed):      Patient/family provided with [facility] Clinical Social Work Department's list of facilities offering this level of care within the geographic area requested by the patient (or if unable, by the patient's family).      Patient/family informed of their freedom to choose among providers that offer the needed level of care, that participate in Medicare, Medicaid or managed care program needed by the patient, have an available bed and are willing to accept the patient.      Patient/family informed of [facility]'s ownership interest in [facility] and [facility], as well as of the fact that they are under no obligation to receive care at these facilities.  PASRR submitted to EDS on       PASRR number received on 06/14/18     Existing PASRR number confirmed on       FL2 transmitted to all facilities in geographic area requested by pt/family on       FL2 transmitted to all facilities within larger geographic area on       Patient informed that his/her managed care company has contracts with or will negotiate with certain facilities, including the following:        Yes   Patient/family informed of bed offers received.  Patient chooses bed at [facility]     Physician recommends and patient chooses bed at      Patient to be transferred to [facility] on 06/14/18.  Patient to be transferred to facility by Alger Memos, daughter     Patient family notified on 06/14/18 of transfer.  Name of family member notified:  Alger Memos, daughter     PHYSICIAN       Additional Comment:     _______________________________________________ Eduard Roux, LCSWA 06/14/2018, 3:12 PM

## 2018-06-14 NOTE — NC FL2 (Signed)
Ensign MEDICAID FL2 LEVEL OF CARE SCREENING TOOL     IDENTIFICATION  Patient Name: Briana Martinez Birthdate: [birth year] Sex: female Admission Date (Current Location): 06/06/2018  [facility] and [place] Number:  Producer, television/film/video and Address:  [facility], [address], [place], [place] [address]      Provider Number: [id number]  Attending Physician Name and Address:  Calvert Cantor, MD  Relative Name and Phone Number:  Brown,Linda    Current Level of Care: Hospital Recommended Level of Care: Skilled Nursing Facility Prior Approval Number:    Date Approved/Denied:   PASRR Number: [id number]  Discharge Plan: SNF    Current Diagnoses: Patient Active Problem List   Diagnosis Date Noted  . Bowel perforation (HCC) 06/14/2018  . Mobitz type 1 second degree atrioventricular block 06/14/2018  . NSVT (nonsustained ventricular tachycardia) (HCC) 06/14/2018  . Benign essential HTN 06/14/2018  . H/O: CVA (cerebrovascular accident) 06/14/2018  . Goals of care, counseling/discussion   . Palliative care by specialist   . DNR (do not resuscitate) discussion   . Encounter for hospice care discussion   . Leukocytosis 06/07/2018  . Asymptomatic bacteriuria 06/07/2018  . Hyponatremia 06/07/2018  . Bradycardia     Orientation RESPIRATION BLADDER Height & Weight     Self, Time, Situation, Place  Normal Incontinent Weight: 140 lb 6.9 oz (63.7 kg) Height:  5\' 3"  (160 cm)  BEHAVIORAL SYMPTOMS/MOOD NEUROLOGICAL BOWEL NUTRITION STATUS      Incontinent Diet(please see discharge summary)  AMBULATORY STATUS COMMUNICATION OF NEEDS Skin     Verbally Normal                       Personal Care Assistance Level of Assistance  Bathing, Feeding, Dressing Bathing Assistance: Limited assistance Feeding assistance: Independent Dressing Assistance: Limited assistance     Functional Limitations Info  Sight, Hearing, Speech Sight Info: Impaired Hearing Info:  Impaired Speech Info: Adequate    SPECIAL CARE FACTORS FREQUENCY  PT (By licensed PT), OT (By licensed OT)     PT Frequency: 5x per wk  OT Frequency: 5x per week            Contractures Contractures Info: Not present    Additional Factors Info  Code Status, Allergies Code Status Info: DNR Allergies Info: NKA           Current Medications (06/14/2018):  This is the current hospital active medication list Current Facility-Administered Medications  Medication Dose Route Frequency Provider Last Rate Last Dose  . acetaminophen (TYLENOL) tablet 650 mg  650 mg Oral Q6H PRN Clydie Braun, MD       Or  . acetaminophen (TYLENOL) suppository 650 mg  650 mg Rectal Q6H PRN Linnemann, Rondell A, MD      . albuterol (PROVENTIL) (2.5 MG/3ML) 0.083% nebulizer solution 2.5 mg  2.5 mg Nebulization Q6H PRN Ola, Rondell A, MD      . amLODipine (NORVASC) tablet 5 mg  5 mg Oral Daily Calvert Cantor, MD   5 mg at 06/14/18 0949  . aspirin tablet 325 mg  325 mg Oral Daily Calvert Cantor, MD   325 mg at 06/14/18 0949  . feeding supplement (BOOST / RESOURCE BREEZE) liquid 1 Container  1 Container Oral BID BM Meuth, Brooke A, PA-C   1 Container at 06/14/18 [phone]  . hydrALAZINE (APRESOLINE) injection 10 mg  10 mg Intravenous Q6H PRN Calvert Cantor, MD   10 mg at 06/11/18  1551  . losartan (COZAAR) tablet 50 mg  50 mg Oral Daily Calvert Cantor, MD   50 mg at 06/14/18 0949  . ondansetron (ZOFRAN) tablet 4 mg  4 mg Oral Q6H PRN Madelyn Flavors A, MD       Or  . ondansetron (ZOFRAN) injection 4 mg  4 mg Intravenous Q6H PRN Hoben, Rondell A, MD      . pantoprazole (PROTONIX) EC tablet 40 mg  40 mg Oral BID Calvert Cantor, MD   40 mg at 06/14/18 1700     Discharge Medications: Please see discharge summary for a list of discharge medications.  Relevant Imaging Results:  Relevant Lab Results:   Additional Information SSN# [id number]  Eduard Roux, LCSWA

## 2018-06-14 NOTE — Progress Notes (Addendum)
Progress Note  Patient Name: Briana Martinez Date of Encounter: 06/14/2018  Primary Cardiologist: Chilton Si, MD   Subjective   Patient denies any chest discomfort or shortness of breath.  She had mild lightheadedness with getting up to the bedside commode yesterday.  She has not been up walking for any distance yet.  She tells me that she has noted a skip in her heart rate when she checks her radial pulse for several years.  Inpatient Medications    Scheduled Meds: . amLODipine  5 mg Oral Daily  . aspirin  325 mg Oral Daily  . feeding supplement  1 Container Oral BID BM  . losartan  50 mg Oral Daily  . pantoprazole (PROTONIX) IV  40 mg Intravenous Q12H   Continuous Infusions:  PRN Meds: acetaminophen **OR** acetaminophen, albuterol, hydrALAZINE, metoprolol tartrate, ondansetron **OR** ondansetron (ZOFRAN) IV   Vital Signs    Vitals:   06/13/18 0910 06/13/18 1256 06/13/18 1926 06/14/18 0538  BP: (!) 148/52 (!) 155/61 127/78 (!) 148/49  Pulse:  71 79 60  Resp:  (!) 22  (!) 24  Temp:  97.7 F (36.5 C) 98.2 F (36.8 C) 98.1 F (36.7 C)  TempSrc:  Oral Oral Oral  SpO2:  99% 95% 95%  Weight:      Height:        Intake/Output Summary (Last 24 hours) at 06/14/2018 0806 Last data filed at 06/14/2018 0738 Gross per 24 hour  Intake 1240 ml  Output 2050 ml  Net -810 ml   Last 3 Weights 06/07/2018 05/28/2015 03/29/2013  Weight (lbs) 140 lb 6.9 oz 163 lb 3.2 oz 172 lb  Weight (kg) 63.7 kg 74.027 kg 78.019 kg      Telemetry    Second-degree AV block type I, rates as low as 39 during sleep, 60s-80s while awake- Personally Reviewed  ECG    No new tracings- Personally Reviewed  Physical Exam   GEN:  Frail, elderly female.  No acute distress.   Neck: No JVD Cardiac: RRR, no murmurs, rubs, or gallops.  Respiratory: Clear to auscultation bilaterally. GI: Soft, nontender, non-distended  MS: No edema; No deformity. Neuro:  Nonfocal  Psych: Normal affect   Labs      Chemistry Recent Labs  Lab 06/09/18 0511  06/11/18 0359 06/12/18 0313 06/14/18 0302  NA 138   < > 138 140 137  K 3.2*   < > 3.6 4.3 4.0  CL 108   < > 109 112* 107  CO2 24   < > 23 22 23   GLUCOSE 151*   < > 135* 129* 99  BUN 8   < > <5* 5* 7*  CREATININE 0.99   < > 1.00 0.99 0.98  CALCIUM 8.3*   < > 8.1* 8.4* 8.5*  PROT 5.7*  --   --   --   --   ALBUMIN 2.1*  --   --   --   --   AST 21  --   --   --   --   ALT 17  --   --   --   --   ALKPHOS 45  --   --   --   --   BILITOT 0.2*  --   --   --   --   GFRNONAA 49*   < > 48* 49* 49*  GFRAA 57*   < > 56* 57* 57*  ANIONGAP 6   < > 6 6 7    < > =  values in this interval not displayed.     Hematology Recent Labs  Lab 06/08/18 0331 06/09/18 0511  WBC 6.7 7.7  RBC 3.01* 3.13*  HGB 9.0* 9.3*  HCT 28.8* 30.5*  MCV 95.7 97.4  MCH 29.9 29.7  MCHC 31.3 30.5  RDW 12.9 12.9  PLT 342 321    Cardiac Enzymes Recent Labs  Lab 06/07/18 1513 06/08/18 0842  TROPONINI <0.03 <0.03   No results for input(s): TROPIPOC in the last 168 hours.   BNPNo results for input(s): BNP, PROBNP in the last 168 hours.   DDimer No results for input(s): DDIMER in the last 168 hours.   Radiology    No results found.  Cardiac Studies   Echocardiogram 06/08/2018 Study Conclusions  - Left ventricle: The cavity size was normal. Wall thickness was   increased in a pattern of mild LVH. Systolic function was normal.   The estimated ejection fraction was in the range of 60% to 65%.   Wall motion was normal; there were no regional wall motion   abnormalities. Left ventricular diastolic function parameters   were normal. - Aortic valve: Trileaflet; mildly thickened, mildly calcified   leaflets. Valve area (VTI): 1.42 cm^2. Valve area (Vmax): 1.56   cm^2. Valve area (Vmean): 1.74 cm^2. - Mitral valve: Calcified annulus. There was trivial regurgitation. - Left atrium: The atrium was mildly dilated. - Tricuspid valve: There was mild  regurgitation. - Inferior vena cava: The vessel was mildly dilated.  Patient Profile     85 y.o. female with a hx of hypertension, diabetes and prior stroke who is being seen today for the evaluation of atrial fibrillation and bradycardia at the request of Dr. Butler Denmarkizwan.  Assessment & Plan    Bradycardia/second-degree AV block type I -Patient reports noting skipped heartbeats for several years.  This may be a longstanding issue for her. -Heart rates down to upper 30s during the night but adequately up to 60s-80s during the day.  She has mild lightheadedness with getting up to the bedside commode, she has been in bed for a week.  Monitor her activity tolerance with advancing activity. -Echo showed normal LV systolic function. -Avoiding AV nodal blocking agents. -Per Dr. Duke Salviaandolph, given her perforated gastric ulcer she is at high risk of infection if a permanent pacemaker were implanted. -Currently I do not see the necessity for pacemaker. -No evidence of atrial fibrillation, therefore no anticoagulation      For questions or updates, please contact [facility] Please consult [email or web address] for contact info under        Signed, Berton BonJanine Hammond, NP  06/14/2018, 8:06 AM    I have personally seen and examined this patient. I agree with the assessment and plan as outlined above. She is stable this am. Telemetry personally reviewed by me. There is sinus rhythm with 1st degree AV block and Mobitz 1 block. She has no dizziness or syncope. No plans for pacemaker. No evidence of atrial fibrillation.  Avoid AV nodal blocking agents.   Verne CarrowChristopher  06/14/2018 8:43 AM

## 2018-06-14 NOTE — Final Consult Note (Signed)
[facility] Progress Note     Subjective: CC: no complaints Patient denies abdominal pain or nausea. Having bowel function. Tolerated FLD.   Objective: Vital signs in last 24 hours: Temp:  [97.7 F (36.5 C)-98.2 F (36.8 C)] 98.1 F (36.7 C) (01/13 0538) Pulse Rate:  [60-79] 60 (01/13 0538) Resp:  [22-24] 24 (01/13 0538) BP: (127-155)/(49-78) 150/55 (01/13 0949) SpO2:  [95 %-99 %] 95 % (01/13 0538) Last BM Date: 06/12/18  Intake/Output from previous day: 01/12 0701 - 01/13 0700 In: 1240 [P.O.:640; I.V.:600] Out: 1250 [Urine:1250] Intake/Output this shift: Total I/O In: -  Out: 800 [Urine:800]  PE: Gen:  Alert, NAD, pleasant Card:  Irregularly irregular Pulm:  Normal effort, clear to auscultation bilaterally Abd: Soft, non-tender, non-distended, bowel sounds present  Skin: warm and dry, no rashes  Psych: A&Ox3   Lab Results:  No results for input(s): WBC, HGB, HCT, PLT in the last 72 hours. BMET Recent Labs    06/12/18 0313 06/14/18 0302  NA 140 137  K 4.3 4.0  CL 112* 107  CO2 22 23  GLUCOSE 129* 99  BUN 5* 7*  CREATININE 0.99 0.98  CALCIUM 8.4* 8.5*   PT/INR No results for input(s): LABPROT, INR in the last 72 hours. CMP     Component Value Date/Time   NA 137 06/14/2018 0302   K 4.0 06/14/2018 0302   CL 107 06/14/2018 0302   CO2 23 06/14/2018 0302   GLUCOSE 99 06/14/2018 0302   BUN 7 (L) 06/14/2018 0302   CREATININE 0.98 06/14/2018 0302   CALCIUM 8.5 (L) 06/14/2018 0302   PROT 5.7 (L) 06/09/2018 0511   ALBUMIN 2.1 (L) 06/09/2018 0511   AST 21 06/09/2018 0511   ALT 17 06/09/2018 0511   ALKPHOS 45 06/09/2018 0511   BILITOT 0.2 (L) 06/09/2018 0511   GFRNONAA 49 (L) 06/14/2018 0302   GFRAA 57 (L) 06/14/2018 0302   Lipase     Component Value Date/Time   LIPASE 102 (H) 06/06/2018 2356       Studies/Results: No results found.  Anti-infectives: Anti-infectives (From admission, onward)   Start     Dose/Rate Route Frequency  Ordered Stop   06/07/18 1400  piperacillin-tazobactam (ZOSYN) IVPB 3.375 g  Status:  Discontinued     3.375 g 12.5 mL/hr over 240 Minutes Intravenous Every 8 hours 06/07/18 0534 06/13/18 1328   06/07/18 0400  piperacillin-tazobactam (ZOSYN) IVPB 3.375 g     3.375 g 12.5 mL/hr over 240 Minutes Intravenous  Once 06/07/18 0351 06/07/18 0836       Assessment/Plan Hypertension Diabetes mellitus History of CVA Atrial fibrillation Bradycardia with atrial fibrillation/second-degree AV block- ECHO1/7 EF 60-65% Right knee pain -plain films show osteoarthritis with small effusion Code status DNR  Deep gastric ulcer/contained perforation along the posterior wall of the stomach -seen on initial UGI 1/6 - high likelihood of malignancy given recent weight loss - per GIshe's not a candidate for EGD due to contained perf - repeat UGI 1/10continues to show contained leak - per discussion with palliative, patient does not wish to consider any surgery,Mrs. Offord is still agreeable to at home, treat the treatable, hospice services; may consider stent in rehab prior to going home  ID -zosyn 1/6>1/12 FEN -advance to soft VTE -SCDs Foley -wick Follow up -TBD  Plan-Advance to soft diet. Continue PO PPI on d/c. We will sign off, please call if we can be of further assistance.   LOS: 7 days    Wells Guiles ,  [facility] 06/14/2018, 10:20 AM Pager: [phone] Consults: [phone] Mon-Fri 7:00 am-4:30 pm Sat-Sun 7:00 am-11:30 am

## 2018-06-14 NOTE — Discharge Summary (Addendum)
Physician Discharge Summary  LASHAWNNA LAMBRECHT ZOX:[id number] DOB: Mar 14, 1922 DOA: 06/06/2018  PCP: Kendell Bane, MD  Admit date: 06/06/2018 Discharge date: 06/14/2018  Admitted From: home Disposition:  SNF   Discharge Condition:  stable   CODE STATUS:  Full code Diet recommendation:  Regular diet Consultations:  gen surgery  Palliative care  cardiology   Discharge Diagnoses:  Principal Problem:   Perforated Viscus Active Problems:   Leukocytosis   Asymptomatic bacteriuria   Hyponatremia   Bradycardia   Goals of care, counseling/discussion   Palliative care by specialist   DNR (do not resuscitate) discussion   Encounter for hospice care discussion   Mobitz type 1 second degree atrioventricular block   NSVT (nonsustained ventricular tachycardia) (HCC)   Benign essential HTN   H/O: CVA (cerebrovascular accident)     Brief Summary: Briana Martinez 85 y.o.femalewith medical history significant ofCVA, and DM type II; who presents with complaints of 2 weeks of epigastric abdominal pain, intermittent vomiting and loss of appetite along with weight loss. Further imaging shows a deep gastric ulcer with a contained perforation along the posterior wall of the stomach.  Gen surgery was consulted.   Hospital Course:  Principal Problem:   Gastric ulcer with contained perforation - see UGI and Ct abdomen results below - patient opting for a less aggressive approach after discussion with palliative care and does not want abdominal surgery - GI did not recommend EGD at this time due to acute perforation - conservative management per gen surgery> advanced to solid food-- doing well  - will need to continue a BID PPI for suspected underlying gastric ulcer  Active Problems:   Leukocytosis - WBC 13.2- - ? Due to above- has normalized  Mobitz type 2 heart block &  Vtach - noted on telemetry monitors- HR drops to 30s at night and normalizes when she is awake - 2 D ECHO noted  below did not show any significant abnormality - noted to also have non sustatained V tach on monitor on 1/9- replacing electrolytes- has not recurred - appreciate cardiology eval- she appears to be asymptomatic at this time in regards to her heart block and cardiology does not feel there is a  need for pacemaker at this time   Hypokalemia/ hyponatremia due to dehydration - replaced  Diarrhea - likely due to antibiotics that she received early on in the admission- Zosyn has since been discontinue by surgery and diarrhea has resolved- no episodes in 48 hrs.     Asymptomatic bacteruria  - no symptoms of a UTI  H/o HTN -on Amlodipine and Cozaar at home which are being continued  H/o CVA - on ASA 325 and Pravastatin as outpt- continue   Anemia - likely of chronic disease  Debilitated - PT recommends SNF   Disposition: per palliative care discussion she is now a DNR-  initial decision was to d/c to home with hospice but she is now wanting to go to rehab first    Procedures:  2 D ECHO Study Conclusions  - Left ventricle: The cavity size was normal. Wall thickness was increased in a pattern of mild LVH. Systolic function was normal. The estimated ejection fraction was in the range of 60% to 65%. Wall motion was normal; there were no regional wall motion abnormalities. Left ventricular diastolic function parameters were normal. - Aortic valve: Trileaflet; mildly thickened, mildly calcified leaflets. Valve area (VTI): 1.42 cm^2. Valve area (Vmax): 1.56 cm^2. Valve area (Vmean): 1.74 cm^2. - Mitral valve:  Calcified annulus. There was trivial regurgitation. - Left atrium: The atrium was mildly dilated. - Tricuspid valve: There was mild regurgitation. - Inferior vena cava: The vessel was mildly dilated.   Discharge Exam: Vitals:   06/14/18 0538 06/14/18 0949  BP: (!) 148/49 (!) 150/55  Pulse: 60   Resp: (!) 24   Temp: 98.1 F (36.7 C)   SpO2: 95%     Vitals:   06/13/18 1256 06/13/18 1926 06/14/18 0538 06/14/18 0949  BP: (!) 155/61 127/78 (!) 148/49 (!) 150/55  Pulse: 71 79 60   Resp: (!) 22  (!) 24   Temp: 97.7 F (36.5 C) 98.2 F (36.8 C) 98.1 F (36.7 C)   TempSrc: Oral Oral Oral   SpO2: 99% 95% 95%   Weight:      Height:        General: Pt is alert, awake, not in acute distress Cardiovascular: RRR, S1/S2 +, no rubs, no gallops Respiratory: CTA bilaterally, no wheezing, no rhonchi Abdominal: Soft, NT, ND, bowel sounds + Extremities: no edema, no cyanosis   Discharge Instructions  Discharge Instructions    Diet - low sodium heart healthy   Complete by:  As directed    Increase activity slowly   Complete by:  As directed      Allergies as of 06/14/2018   No Known Allergies     Medication List    STOP taking these medications   traMADol 50 MG tablet Commonly known as:  ULTRAM     TAKE these medications   acetaminophen 325 MG tablet Commonly known as:  TYLENOL Take 2 tablets (650 mg total) by mouth every 6 (six) hours as needed for mild pain (or Fever >/= 101).   amLODipine 5 MG tablet Commonly known as:  NORVASC Take 5 mg by mouth daily.   aspirin 325 MG tablet Take 325 mg by mouth daily.   cholecalciferol 25 MCG (1000 UT) tablet Commonly known as:  VITAMIN D3 Take 2,000 Units by mouth daily.   losartan 50 MG tablet Commonly known as:  COZAAR Take 50 mg by mouth daily.   MULTI-VITAMIN PO Take 1 tablet by mouth daily. Reported on 05/28/2015   pantoprazole 40 MG tablet Commonly known as:  PROTONIX Take 1 tablet (40 mg total) by mouth 2 (two) times daily.   pravastatin 20 MG tablet Commonly known as:  PRAVACHOL Take 20 mg by mouth daily.   PRESERVISION AREDS PO Take by mouth 2 (two) times daily. Reported on 05/28/2015       No Known Allergies   Procedures/Studies:    Ct Abdomen Pelvis W Contrast  Result Date: 06/07/2018 CLINICAL DATA:  85 year old with acute abdominal pain.  Nausea and vomiting for weeks. EXAM: CT ABDOMEN AND PELVIS WITH CONTRAST TECHNIQUE: Multidetector CT imaging of the abdomen and pelvis was performed using the standard protocol following bolus administration of intravenous contrast. CONTRAST:  100mL OMNIPAQUE IOHEXOL 300 MG/ML  SOLN COMPARISON:  Report from abdominal CT 03/31/2014 at an outside institution reviewed in Care Everywhere, images not available. FINDINGS: Lower chest: Ovoid paravertebral lesion at the right lung base is homogeneous attenuation with well-defined lobulated contours, extra vertebral component measuring 3.6 x 2.0 x 3.9 cm and extending into the right T11-T12 neural foramen with chronic osseous remottling. Component extends into the spinal canal. There is associated atelectasis in the right lower lobe. Coronary artery calcifications. Calcified noncalcified atheromatous plaque involving the descending thoracic aorta. Hepatobiliary: Focal hepatic lesion. Gallbladder filled with stones. No pericholecystic inflammation.  No biliary dilatation. Pancreas: No ductal dilatation or inflammation. Spleen: Normal in size without focal abnormality. Adrenals/Urinary Tract: Mild right adrenal thickening without dominant nodule. The left adrenal gland is normal. No hydronephrosis or perinephric edema. Homogeneous renal enhancement with symmetric excretion on delayed phase imaging. Small cyst in the upper left kidney. Urinary bladder is partially distended without wall thickening. Stomach/Bowel: Small hiatal hernia. There is a rounded outpouching of fluid from the lesser curvature of the stomach with the neck of 11 mm, image 23 series 3. Significant normal appendix. Moderate to large colonic stool burden. No colonic wall thickening or inflammatory change. Sigmoid diverticulosis without diverticulitis. Surrounding fat stranding and inflammatory change. Mild associated gastric wall thickening. The small bowel is decompressed without inflammatory change.  Vascular/Lymphatic: Calcified noncalcified irregular plaque throughout the lower thoracic and abdominal aorta. No aneurysm. Mesenteric vessels appear patent. Portal vein is patent. There is prominent left adnexal vascularity and dilatation of the left ovarian vein to 6 mm. No adenopathy. Reproductive: Post hysterectomy. Prominent left adnexal vascularity with dilatation of the ovarian vein at 6 mm. No adnexal mass. Other: Small amount of free fluid in the pelvis. Suspect small amount of fluid in the lesser sac adjacent gastric inflammation. No free air or loculated abscess. Musculoskeletal: Multilevel degenerative change in the lumbar spine. Grade 1 anterolisthesis of L5 on S1 is likely facet mediated. Perivertebral mass in the lower thorax as described above. IMPRESSION: 1. Fluid-filled gastric outpouching from the lesser curvature. There is significant surrounding inflammatory change. Findings are suspicious for early or contained gastric perforation, possible peptic ulcer disease, less likely gastric diverticulum which is inflamed. No extraluminal air. Recommend surgical consultation. 2. Chronic findings include gallstones and colonic diverticulosis without diverticulitis. Small hiatal hernia. 3. 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 This was described on outside prior exam. 4. Possible pelvic congestion. 5.  Aortic Atherosclerosis (ICD10-I70.0). These results were called by telephone at the time of interpretation on 06/07/2018 at 3:09 am to Dr. Geoffery Lyons , who verbally acknowledged these results. Electronically Signed   By: Narda Rutherford M.D.   On: 06/07/2018 03:10   Dg Knee Right Port  Result Date: 06/09/2018 CLINICAL DATA:  Posterior right knee pain EXAM: PORTABLE RIGHT KNEE - 1-2 VIEW COMPARISON:  None FINDINGS: Advanced tricompartment degenerative changes with joint space narrowing and spurring. Small joint effusion. Rounded corticated calcifications inferior to the patella  in the anterior soft tissues, likely related to old injury. No acute fracture, subluxation or dislocation. IMPRESSION: Advanced osteoarthritis. Small joint effusion. No acute bony abnormality. Electronically Signed   By: Charlett Nose M.D.   On: 06/09/2018 09:47   Dg Roselle Locus Scout & Delayed Images W Single Cm  Result Date: 06/11/2018 CLINICAL DATA:  85 year old female with perforated gastric ulcer noted on prior CT examination. Follow-up study. EXAM: WATER SOLUBLE UPPER GI SERIES TECHNIQUE: Single-column upper GI series was performed using water soluble contrast. CONTRAST:  OMNIPAQUE IOHEXOL 300 MG/ML  SOLN COMPARISON:  Upper GI 06/07/2018. CT the abdomen and pelvis 06/07/2018. FLUOROSCOPY TIME:  Fluoroscopy Time:  1 minutes and 6 seconds Radiation Exposure Index (if provided by the fluoroscopic device): 11.6 mGy FINDINGS: Limited single contrast upper GI again demonstrates the presence of a perforated ulcer along the proximal aspect of the lesser curvature of the stomach which appears contained. No free-flowing contrast into the peritoneal cavity was identified. IMPRESSION: Similar appearance of contained perforation in the proximal aspect of the lesser curvature of the stomach, presumably  from a perforated gastric ulcer. Electronically Signed   By: Trudie Reed M.D.   On: 06/11/2018 11:43   Dg Kayleen Memos W/water Sol Cm  Result Date: 06/07/2018 CLINICAL DATA:  Epigastric pain EXAM: WATER SOLUBLE UPPER GI SERIES TECHNIQUE: Single-column upper GI series was performed using water soluble contrast. CONTRAST:  OMNIPAQUE IOHEXOL 300 MG/ML  SOLN COMPARISON:  Abdominal CT from earlier the same day FLUOROSCOPY TIME:  Fluoroscopy Time:  2 minutes Radiation Exposure Index (if provided by the fluoroscopic device): 20.6 mGy Number of Acquired Spot Images: 6 FINDINGS: KUB shows a normal bowel gas pattern. There is excreting contrast in the collecting system from recent enhanced CT. Oblique pharyngeal imaging shows  good oral coordination and airway protection. There a transient lateral pharyngeal outpouchings without stasis. The esophagus has normal distensibility and a smooth mucosal contour. There is a small transient sliding hiatal hernia. The stomach has normal shape and distensibility. A posterior wall ulcer is readily identified and measures up to 2.3 cm. No intraperitoneal or retroperitoneal leak is seen. No evident keeping at the margins, although this is a single contrast water soluble study. No gastric outlet obstruction. Duodenal fold pattern is unremarkable. Spontaneous gastroesophageal reflux to the thoracic inlet. IMPRESSION: 1. Deep gastric ulcer/contained perforation along the posterior wall of the stomach. 2. Prominent gastroesophageal reflux. Electronically Signed   By: Marnee Spring M.D.   On: 06/07/2018 08:01     The results of significant diagnostics from this hospitalization (including imaging, microbiology, ancillary and laboratory) are listed below for reference.     Microbiology: Recent Results (from the past 240 hour(s))  Urine Culture     Status: Abnormal   Collection Time: 06/07/18 12:00 AM  Result Value Ref Range Status   Specimen Description URINE, RANDOM  Final   Special Requests   Final    NONE Performed at [facility], [address] [place], [place] [address]    Culture >=100,000 COLONIES/mL ESCHERICHIA COLI (A)  Final   Report Status 06/09/2018 FINAL  Final   Organism ID, Bacteria ESCHERICHIA COLI (A)  Final      Susceptibility   Escherichia coli - MIC*    AMPICILLIN 4 SENSITIVE Sensitive     CEFAZOLIN <=4 SENSITIVE Sensitive     CEFTRIAXONE <=1 SENSITIVE Sensitive     CIPROFLOXACIN <=0.25 SENSITIVE Sensitive     GENTAMICIN <=1 SENSITIVE Sensitive     IMIPENEM <=0.25 SENSITIVE Sensitive     NITROFURANTOIN <=16 SENSITIVE Sensitive     TRIMETH/SULFA <=20 SENSITIVE Sensitive     AMPICILLIN/SULBACTAM <=2 SENSITIVE Sensitive     PIP/TAZO <=4 SENSITIVE  Sensitive     Extended ESBL NEGATIVE Sensitive     * >=100,000 COLONIES/mL ESCHERICHIA COLI     Labs: BNP (last 3 results) No results for input(s): BNP in the last 8760 hours. Basic Metabolic Panel: Recent Labs  Lab 06/08/18 0842 06/09/18 0511 06/10/18 0312 06/11/18 0359 06/12/18 0313 06/14/18 0302  NA  --  138 138 138 140 137  K  --  3.2* 3.2* 3.6 4.3 4.0  CL  --  108 108 109 112* 107  CO2  --  24 23 23 22 23   GLUCOSE  --  151* 138* 135* 129* 99  BUN  --  8 5* <5* 5* 7*  CREATININE  --  0.99 0.92 1.00 0.99 0.98  CALCIUM  --  8.3* 8.3* 8.1* 8.4* 8.5*  MG 1.8 1.8 1.7 2.1  --   --  Liver Function Tests: Recent Labs  Lab 06/09/18 0511  AST 21  ALT 17  ALKPHOS 45  BILITOT 0.2*  PROT 5.7*  ALBUMIN 2.1*   No results for input(s): LIPASE, AMYLASE in the last 168 hours. No results for input(s): AMMONIA in the last 168 hours. CBC: Recent Labs  Lab 06/08/18 0331 06/09/18 0511  WBC 6.7 7.7  HGB 9.0* 9.3*  HCT 28.8* 30.5*  MCV 95.7 97.4  PLT 342 321   Cardiac Enzymes: Recent Labs  Lab 06/07/18 1513 06/08/18 0842  TROPONINI <0.03 <0.03   BNP: Invalid input(s): POCBNP CBG: Recent Labs  Lab 06/12/18 1630 06/12/18 2135 06/13/18 0654 06/13/18 1233 06/13/18 1639  GLUCAP 146* 111* 114* 160* 119*   D-Dimer No results for input(s): DDIMER in the last 72 hours. Hgb A1c No results for input(s): HGBA1C in the last 72 hours. Lipid Profile No results for input(s): CHOL, HDL, LDLCALC, TRIG, CHOLHDL, LDLDIRECT in the last 72 hours. Thyroid function studies No results for input(s): TSH, T4TOTAL, T3FREE, THYROIDAB in the last 72 hours.  Invalid input(s): FREET3 Anemia work up No results for input(s): VITAMINB12, FOLATE, FERRITIN, TIBC, IRON, RETICCTPCT in the last 72 hours. Urinalysis    Component Value Date/Time   COLORURINE YELLOW 06/07/2018 0000   APPEARANCEUR HAZY (A) 06/07/2018 0000   LABSPEC 1.014 06/07/2018 0000   PHURINE 5.0 06/07/2018 0000    GLUCOSEU 50 (A) 06/07/2018 0000   HGBUR NEGATIVE 06/07/2018 0000   BILIRUBINUR NEGATIVE 06/07/2018 0000   KETONESUR 5 (A) 06/07/2018 0000   PROTEINUR 30 (A) 06/07/2018 0000   NITRITE NEGATIVE 06/07/2018 0000   LEUKOCYTESUR MODERATE (A) 06/07/2018 0000   Sepsis Labs Invalid input(s): PROCALCITONIN,  WBC,  LACTICIDVEN Microbiology Recent Results (from the past 240 hour(s))  Urine Culture     Status: Abnormal   Collection Time: 06/07/18 12:00 AM  Result Value Ref Range Status   Specimen Description URINE, RANDOM  Final   Special Requests   Final    NONE Performed at [facility], [address], [place], [place] [address]    Culture >=100,000 COLONIES/mL ESCHERICHIA COLI (A)  Final   Report Status 06/09/2018 FINAL  Final   Organism ID, Bacteria ESCHERICHIA COLI (A)  Final      Susceptibility   Escherichia coli - MIC*    AMPICILLIN 4 SENSITIVE Sensitive     CEFAZOLIN <=4 SENSITIVE Sensitive     CEFTRIAXONE <=1 SENSITIVE Sensitive     CIPROFLOXACIN <=0.25 SENSITIVE Sensitive     GENTAMICIN <=1 SENSITIVE Sensitive     IMIPENEM <=0.25 SENSITIVE Sensitive     NITROFURANTOIN <=16 SENSITIVE Sensitive     TRIMETH/SULFA <=20 SENSITIVE Sensitive     AMPICILLIN/SULBACTAM <=2 SENSITIVE Sensitive     PIP/TAZO <=4 SENSITIVE Sensitive     Extended ESBL NEGATIVE Sensitive     * >=100,000 COLONIES/mL ESCHERICHIA COLI     Time coordinating discharge in minutes: 65  SIGNED:   Calvert Cantor, MD  [facility] 06/14/2018, 11:32 AM Pager   If 7PM-7AM, please contact night-coverage [email or web address] Password [email or web address]

## 2018-06-14 NOTE — Progress Notes (Signed)
Occupational Therapy Treatment Patient Details Name: Briana Martinez MRN: [id number] DOB: June 07, 1921 Today's Date: 06/14/2018    History of present illness 85yo female c/o epigastric pain, malaise and weakness, CT concerning for possible perforation, diverticulum, or peptic ulcer. PMH DM, CVA, joint replacement, HOH    OT comments  Pt making good progress with functional goals and continue to recommend ST [facility] at [facility] after acute stay d/c to maximize function and safety before returning home  Follow Up Recommendations  SNF    Equipment Recommendations  None recommended by OT    Recommendations for Other Services      Precautions / Restrictions Precautions Precautions: Fall Restrictions Weight Bearing Restrictions: No LLE Weight Bearing: Weight bearing as tolerated       Mobility Bed Mobility               General bed mobility comments: pt sitting on EOB with nursing staff present upon arrival  Transfers Overall transfer level: Needs assistance Equipment used: Rolling walker (2 wheeled) Transfers: Sit to/from Stand Sit to Stand: Min assist         General transfer comment: Assist to power up to standing    Balance Overall balance assessment: Needs assistance;History of Falls Sitting-balance support: Bilateral upper extremity supported;Feet supported Sitting balance-Leahy Scale: Good     Standing balance support: During functional activity;No upper extremity supported Standing balance-Leahy Scale: Fair                             ADL either performed or assessed with clinical judgement   ADL Overall ADL's : Needs assistance/impaired     Grooming: Wash/dry hands;Wash/dry face;Standing;Min guard;Oral care       Lower Body Bathing: Minimal assistance Lower Body Bathing Details (indicate cue type and reason): simulated     Lower Body Dressing: Minimal assistance   Toilet Transfer: Minimal assistance;Ambulation;RW;Comfort height  toilet;Grab bars;Cueing for safety   Toileting- Clothing Manipulation and Hygiene: Sit to/from stand;Min guard       Functional mobility during ADLs: Minimal assistance;Rolling walker;Cueing for safety       Vision Patient Visual Report: No change from baseline     Perception     Praxis      Cognition Arousal/Alertness: Awake/alert Behavior During Therapy: WFL for tasks assessed/performed Overall Cognitive Status: Impaired/Different from baseline Area of Impairment: Memory;Awareness;Problem solving                     Memory: Decreased short-term memory       Problem Solving: Difficulty sequencing;Requires verbal cues;Requires tactile cues          Exercises     Shoulder Instructions       General Comments      Pertinent Vitals/ Pain       Pain Assessment: No/denies pain Pain Score: 0-No pain  Home Living                                          Prior Functioning/Environment              Frequency  Min 2X/week        Progress Toward Goals  OT Goals(current goals can now be found in the care plan section)  Progress towards OT goals: Progressing toward goals     Plan  Co-evaluation                 AM-PAC OT "6 Clicks" Daily Activity     Outcome Measure   Help from another person eating meals?: None Help from another person taking care of personal grooming?: A Little Help from another person toileting, which includes using toliet, bedpan, or urinal?: A Little Help from another person bathing (including washing, rinsing, drying)?: A Little Help from another person to put on and taking off regular upper body clothing?: A Little Help from another person to put on and taking off regular lower body clothing?: A Little 6 Click Score: 19    End of Session Equipment Utilized During Treatment: Gait belt;Rolling walker  OT Visit Diagnosis: Unsteadiness on feet (R26.81);Other abnormalities of gait and  mobility (R26.89);Muscle weakness (generalized) (M62.81)   Activity Tolerance Patient tolerated treatment well   Patient Left in chair;with call bell/phone within reach;with chair alarm set;with family/visitor present   Nurse Communication          Time: [id number] OT Time Calculation (min): 29 min  Charges: OT General Charges $OT Visit: 1 Visit OT Treatments $Self Care/Home Management : 8-22 mins $Therapeutic Activity: 8-22 mins     Galen ManilaSpencer, Dom Haverland Jeanette 06/14/2018, 1:38 PM

## 2018-06-15 ENCOUNTER — Encounter: Payer: Self-pay | Admitting: Internal Medicine

## 2018-06-15 ENCOUNTER — Non-Acute Institutional Stay (SKILLED_NURSING_FACILITY): Payer: Medicare Other | Admitting: Internal Medicine

## 2018-06-15 DIAGNOSIS — K251 Acute gastric ulcer with perforation: Secondary | ICD-10-CM

## 2018-06-15 DIAGNOSIS — I441 Atrioventricular block, second degree: Secondary | ICD-10-CM | POA: Diagnosis not present

## 2018-06-15 DIAGNOSIS — I472 Ventricular tachycardia: Secondary | ICD-10-CM | POA: Diagnosis not present

## 2018-06-15 DIAGNOSIS — E876 Hypokalemia: Secondary | ICD-10-CM

## 2018-06-15 DIAGNOSIS — Z8673 Personal history of transient ischemic attack (TIA), and cerebral infarction without residual deficits: Secondary | ICD-10-CM

## 2018-06-15 DIAGNOSIS — I10 Essential (primary) hypertension: Secondary | ICD-10-CM | POA: Diagnosis not present

## 2018-06-15 DIAGNOSIS — I4729 Other ventricular tachycardia: Secondary | ICD-10-CM

## 2018-06-15 DIAGNOSIS — E871 Hypo-osmolality and hyponatremia: Secondary | ICD-10-CM

## 2018-06-15 DIAGNOSIS — E785 Hyperlipidemia, unspecified: Secondary | ICD-10-CM

## 2018-06-15 DIAGNOSIS — E1169 Type 2 diabetes mellitus with other specified complication: Secondary | ICD-10-CM

## 2018-06-15 NOTE — Progress Notes (Signed)
:    Location:  [facility] Room Number: 423D  Randon Goldsmith. Lyn Hollingshead, MD  Patient Care Team: Kendell Bane, MD as PCP - General (Family Medicine) Chilton Si, MD as PCP - Cardiology (Cardiology)  Extended Emergency Contact Information Primary Emergency Contact: Brown,Linda Address: [address]          [place] [address] [place] Home Phone: [phone] Mobile Phone: [phone] Relation: Daughter Secondary Emergency Contact: Leola [place] Mobile Phone: [phone] Relation: Granddaughter Preferred language: English     Allergies: Patient has no known allergies.  Chief Complaint  Patient presents with  . New Admit To SNF    Admit to [facility]    HPI: Patient is 85 y.o. female with history of CVA, diabetes mellitus type 2 who presented to [facility] with complaint of 2 weeks of epigastric abdominal pain accompanied by nausea with self-induced emesis.  Patient says she felt generalized malaise and weakness due to her symptoms.  She denied any fever chills diarrhea chest pain shortness of breath recent trauma falls or dysuria.  On admission to the ED emergency department patient's vital signs were stable, WBC was 13.2 hemoglobin was 10.4 sodium 128 and lipase 102.  CT scan of the abdomen/pelvis gave concern for inflammation around the stomach concerning for perforation diverticulum or PUD.  Patient was admitted to [facility] from 1/5-13 where she was found to have a gastric ulcer with contained perforation.  Patient opted for less aggressive approach after discussion with palliative care and does not want abdominal surgery.  Patient will be on PPI twice daily for suspected underlying gastric ulcer..  Patient is hypokalemia and hyponatremia were felt to be due to dehydration and more replaced without problem and patient's diarrhea early in the admission was felt to be secondary to her IV Zosyn.  Patient is being  admitted to skilled nursing facility for OT/PT.  While at skilled nursing facility patient will be followed for hypertension treated with Norvasc and Cozaar, history of CVA treated with ASA and statin and and hyperlipidemia treated with Pravachol.  Past Medical History:  Diagnosis Date  . Abdominal pain 06/2018  . AVB (atrioventricular block)   . Cataract   . Complication of anesthesia    " The doctor told me not to be put tp sleep again, I dont know why "  . Diabetes mellitus without complication (HCC)   . Stroke [facility])     Past Surgical History:  Procedure Laterality Date  . ABDOMINAL HYSTERECTOMY    . CATARACT EXTRACTION, BILATERAL    . JOINT REPLACEMENT      Allergies as of 06/15/2018   No Known Allergies     Medication List       Accurate as of June 15, 2018  9:18 AM. Always use your most recent med list.        acetaminophen 325 MG tablet Commonly known as:  TYLENOL Take 2 tablets (650 mg total) by mouth every 6 (six) hours as needed for mild pain (or Fever >/= 101).   amLODipine 5 MG tablet Commonly known as:  NORVASC Take 5 mg by mouth daily.   aspirin 325 MG tablet Take 325 mg by mouth daily.   cholecalciferol 25 MCG (1000 UT) tablet Commonly known as:  VITAMIN D3 Take 2,000 Units by mouth daily.   losartan 50 MG tablet Commonly known as:  COZAAR Take 50 mg by mouth daily.   MULTI-VITAMIN PO Take 1  tablet by mouth daily. Reported on 05/28/2015   pantoprazole 40 MG tablet Commonly known as:  PROTONIX Take 1 tablet (40 mg total) by mouth 2 (two) times daily.   pravastatin 20 MG tablet Commonly known as:  PRAVACHOL Take 20 mg by mouth daily.   PRESERVISION AREDS PO Take by mouth 2 (two) times daily. Reported on 05/28/2015       No orders of the defined types were placed in this encounter.   Immunization History  Administered Date(s) Administered  . Influenza,inj,Quad PF,6+ Mos 05/28/2015    Social History   Tobacco Use  . Smoking  status: Never Smoker  . Smokeless tobacco: Never Used  Substance Use Topics  . Alcohol use: Not Currently    Family history is brother with diabetes mellitus  History reviewed. No pertinent family history.    Review of Systems  DATA OBTAINED: from patient, nurse GENERAL:  no fevers, fatigue, appetite changes SKIN: No itching, or rash EYES: No eye pain, redness, discharge EARS: No earache, tinnitus, change in hearing NOSE: No congestion, drainage or bleeding  MOUTH/THROAT: No mouth or tooth pain, No sore throat RESPIRATORY: No cough, wheezing, SOB CARDIAC: No chest pain, palpitations, lower extremity edema  GI: No abdominal pain, No N/V/D or constipation, No heartburn or reflux  GU: No dysuria, frequency or urgency, or incontinence  MUSCULOSKELETAL: No unrelieved bone/joint pain NEUROLOGIC: No headache, dizziness or focal weakness PSYCHIATRIC: No c/o anxiety or sadness   Vitals:   06/15/18 0912  BP: (!) 127/47  Pulse: 62  Resp: 16  Temp: (!) 96.8 F (36 C)    SpO2 Readings from Last 1 Encounters:  06/14/18 100%   Body mass index is 25.54 kg/m.     Physical Exam  GENERAL APPEARANCE: Alert, conversant,  No acute distress.  SKIN: No diaphoresis rash HEAD: Normocephalic, atraumatic  EYES: Conjunctiva/lids clear. Pupils round, reactive. EOMs intact.  EARS: External exam WNL, canals clear. Hearing grossly normal.  NOSE: No deformity or discharge.  MOUTH/THROAT: Lips w/o lesions  RESPIRATORY: Breathing is even, unlabored. Lung sounds are clear   CARDIOVASCULAR: Heart RRR no murmurs, rubs or gallops. No peripheral edema.   GASTROINTESTINAL: Abdomen is soft, mild right upper quadrant tender, not distended w/ normal bowel sounds. GENITOURINARY: Bladder non tender, not distended  MUSCULOSKELETAL: No abnormal joints or musculature NEUROLOGIC:  Cranial nerves 2-12 grossly intact. Moves all extremities  PSYCHIATRIC: Mood and affect appropriate to situation, no behavioral  issues  Patient Active Problem List   Diagnosis Date Noted  . Bowel perforation (HCC) 06/14/2018  . Mobitz type 1 second degree atrioventricular block 06/14/2018  . NSVT (nonsustained ventricular tachycardia) (HCC) 06/14/2018  . Benign essential HTN 06/14/2018  . H/O: CVA (cerebrovascular accident) 06/14/2018  . Goals of care, counseling/discussion   . Palliative care by specialist   . DNR (do not resuscitate) discussion   . Encounter for hospice care discussion   . Leukocytosis 06/07/2018  . Asymptomatic bacteriuria 06/07/2018  . Hyponatremia 06/07/2018  . Bradycardia       Labs reviewed: Basic Metabolic Panel:    Component Value Date/Time   NA 137 06/14/2018 0302   K 4.0 06/14/2018 0302   CL 107 06/14/2018 0302   CO2 23 06/14/2018 0302   GLUCOSE 99 06/14/2018 0302   BUN 7 (L) 06/14/2018 0302   CREATININE 0.98 06/14/2018 0302   CALCIUM 8.5 (L) 06/14/2018 0302   PROT 5.7 (L) 06/09/2018 0511   ALBUMIN 2.1 (L) 06/09/2018 0511   AST 21 06/09/2018  0511   ALT 17 06/09/2018 0511   ALKPHOS 45 06/09/2018 0511   BILITOT 0.2 (L) 06/09/2018 0511   GFRNONAA 49 (L) 06/14/2018 0302   GFRAA 57 (L) 06/14/2018 0302    Recent Labs    06/09/18 0511 06/10/18 0312 06/11/18 0359 06/12/18 0313 06/14/18 0302  NA 138 138 138 140 137  K 3.2* 3.2* 3.6 4.3 4.0  CL 108 108 109 112* 107  CO2 24 23 23 22 23   GLUCOSE 151* 138* 135* 129* 99  BUN 8 5* <5* 5* 7*  CREATININE 0.99 0.92 1.00 0.99 0.98  CALCIUM 8.3* 8.3* 8.1* 8.4* 8.5*  MG 1.8 1.7 2.1  --   --    Liver Function Tests: Recent Labs    06/06/18 2356 06/09/18 0511  AST 14* 21  ALT 14 17  ALKPHOS 53 45  BILITOT 0.5 0.2*  PROT 7.4 5.7*  ALBUMIN 3.0* 2.1*   Recent Labs    06/06/18 2356  LIPASE 102*   No results for input(s): AMMONIA in the last 8760 hours. CBC: Recent Labs    06/06/18 2356 06/08/18 0331 06/09/18 0511  WBC 13.2* 6.7 7.7  HGB 10.4* 9.0* 9.3*  HCT 32.0* 28.8* 30.5*  MCV 92.8 95.7 97.4  PLT 438*  342 321   Lipid No results for input(s): CHOL, HDL, LDLCALC, TRIG in the last 8760 hours.  Cardiac Enzymes: Recent Labs    06/07/18 1513 06/08/18 0842  TROPONINI <0.03 <0.03   BNP: No results for input(s): BNP in the last 8760 hours. No results found for: [facility] Lab Results  Component Value Date   HGBA1C (H) 10/26/2009    6.1 (NOTE)                                                                       According to the ADA Clinical Practice Recommendations for 2011, when HbA1c is used as a screening test:   >=6.5%   Diagnostic of Diabetes Mellitus           (if abnormal result  is confirmed)  5.7-6.4%   Increased risk of developing Diabetes Mellitus  References:Diagnosis and Classification of Diabetes Mellitus,Diabetes Care,2011,34(Suppl 1):S62-S69 and Standards of Medical Care in         Diabetes - 2011,Diabetes Care,2011,34  (Suppl 1):S11-S61.   Lab Results  Component Value Date   TSH 1.687 06/07/2018   No results found for: VITAMINB12 No results found for: FOLATE No results found for: IRON, TIBC, FERRITIN  Imaging and Procedures obtained prior to SNF admission: Ct Abdomen Pelvis W Contrast  Result Date: 06/07/2018 CLINICAL DATA:  85 year old with acute abdominal pain. Nausea and vomiting for weeks. EXAM: CT ABDOMEN AND PELVIS WITH CONTRAST TECHNIQUE: Multidetector CT imaging of the abdomen and pelvis was performed using the standard protocol following bolus administration of intravenous contrast. CONTRAST:  OMNIPAQUE IOHEXOL 300 MG/ML  SOLN COMPARISON:  Report from abdominal CT 03/31/2014 at an outside institution reviewed in Care Everywhere, images not available. FINDINGS: Lower chest: Ovoid paravertebral lesion at the right lung base is homogeneous attenuation with well-defined lobulated contours, extra vertebral component measuring 3.6 x 2.0 x 3.9 cm and extending into the right T11-T12 neural foramen with chronic osseous remottling. Component extends into the spinal  canal. There is associated atelectasis in the right lower lobe. Coronary artery calcifications. Calcified noncalcified atheromatous plaque involving the descending thoracic aorta. Hepatobiliary: Focal hepatic lesion. Gallbladder filled with stones. No pericholecystic inflammation. No biliary dilatation. Pancreas: No ductal dilatation or inflammation. Spleen: Normal in size without focal abnormality. Adrenals/Urinary Tract: Mild right adrenal thickening without dominant nodule. The left adrenal gland is normal. No hydronephrosis or perinephric edema. Homogeneous renal enhancement with symmetric excretion on delayed phase imaging. Small cyst in the upper left kidney. Urinary bladder is partially distended without wall thickening. Stomach/Bowel: Small hiatal hernia. There is a rounded outpouching of fluid from the lesser curvature of the stomach with the neck of 11 mm, image 23 series 3. Significant normal appendix. Moderate to large colonic stool burden. No colonic wall thickening or inflammatory change. Sigmoid diverticulosis without diverticulitis. Surrounding fat stranding and inflammatory change. Mild associated gastric wall thickening. The small bowel is decompressed without inflammatory change. Vascular/Lymphatic: Calcified noncalcified irregular plaque throughout the lower thoracic and abdominal aorta. No aneurysm. Mesenteric vessels appear patent. Portal vein is patent. There is prominent left adnexal vascularity and dilatation of the left ovarian vein to 6 mm. No adenopathy. Reproductive: Post hysterectomy. Prominent left adnexal vascularity with dilatation of the ovarian vein at 6 mm. No adnexal mass. Other: Small amount of free fluid in the pelvis. Suspect small amount of fluid in the lesser sac adjacent gastric inflammation. No free air or loculated abscess. Musculoskeletal: Multilevel degenerative change in the lumbar spine. Grade 1 anterolisthesis of L5 on S1 is likely facet mediated. Perivertebral mass  in the lower thorax as described above. IMPRESSION: 1. Fluid-filled gastric outpouching from the lesser curvature. There is significant surrounding inflammatory change. Findings are suspicious for early or contained gastric perforation, possible peptic ulcer disease, less likely gastric diverticulum which is inflamed. No extraluminal air. Recommend surgical consultation. 2. Chronic findings include gallstones and colonic diverticulosis without diverticulitis. Small hiatal hernia. 3. 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 This was described on outside prior exam. 4. Possible pelvic congestion. 5.  Aortic Atherosclerosis (ICD10-I70.0). These results were called by telephone at the time of interpretation on 06/07/2018 at 3:09 am to Dr. Geoffery Lyons , who verbally acknowledged these results. Electronically Signed   By: Narda Rutherford M.D.   On: 06/07/2018 03:10   Dg Kayleen Memos W/water Sol Cm  Result Date: 06/07/2018 CLINICAL DATA:  Epigastric pain EXAM: WATER SOLUBLE UPPER GI SERIES TECHNIQUE: Single-column upper GI series was performed using water soluble contrast. CONTRAST:  OMNIPAQUE IOHEXOL 300 MG/ML  SOLN COMPARISON:  Abdominal CT from earlier the same day FLUOROSCOPY TIME:  Fluoroscopy Time:  2 minutes Radiation Exposure Index (if provided by the fluoroscopic device): 20.6 mGy Number of Acquired Spot Images: 6 FINDINGS: KUB shows a normal bowel gas pattern. There is excreting contrast in the collecting system from recent enhanced CT. Oblique pharyngeal imaging shows good oral coordination and airway protection. There a transient lateral pharyngeal outpouchings without stasis. The esophagus has normal distensibility and a smooth mucosal contour. There is a small transient sliding hiatal hernia. The stomach has normal shape and distensibility. A posterior wall ulcer is readily identified and measures up to 2.3 cm. No intraperitoneal or retroperitoneal leak is seen. No evident keeping at  the margins, although this is a single contrast water soluble study. No gastric outlet obstruction. Duodenal fold pattern is unremarkable. Spontaneous gastroesophageal reflux to the thoracic inlet. IMPRESSION: 1. Deep gastric ulcer/contained perforation along the posterior wall of  the stomach. 2. Prominent gastroesophageal reflux. Electronically Signed   By: Marnee SpringJonathon  Watts M.D.   On: 06/07/2018 08:01     Not all labs, radiology exams or other studies done during hospitalization come through on my EPIC note; however they are reviewed by me.    Assessment and Plan  Gastric ulcer/contained perforation- patient opting for a non-aggressive approach after discussion with palliative care-does not want abdominal surgery and an EGD is not indicated or recommended at this time; patient will need to continue twice daily PPI for underlying gastric ulcer; patient is tolerating her food well SNF- admitted for OT/PT; continue Protonix 40 mg twice daily  Mobitz type II heart block/V. tach- on telemetry monitors; heart rate drops into the 30s at night and normalizes when she is awake; 2D echo did not show any significant abnormality also noted to have nonsustained V. tach on monitor on 1/9-replacing electrolytes with no recurrence; cardiology does not feel necessary for pacer at this time  Hypokalemia/hyponatremia- improved with IV fluids and repleted SNF- follow-up BMP  Diarrhea early in admission-resolved after Zosyn was DC'd  Hypertension SNF- controlled; continue Norvasc 5 mg daily and losartan 50 mg daily  History of CVA  SNF- continue ASA 325 daily as prophylaxis; patient is on statin, patient is prediabetic with A1c of 6.1 and an age of 85 this is satisfactory  Hyperlipidemia SNF-not stated as uncontrolled; continue Pravachol 20 mg daily     Time spent greater than 45 minutes;> 50% of time with patient was spent reviewing records, labs, tests and studies, counseling and developing plan of  care  Thurston Holenne D. Lyn HollingsheadAlexander, MD

## 2018-06-17 ENCOUNTER — Encounter: Payer: Self-pay | Admitting: Internal Medicine

## 2018-06-17 DIAGNOSIS — K255 Chronic or unspecified gastric ulcer with perforation: Secondary | ICD-10-CM | POA: Insufficient documentation

## 2018-06-17 DIAGNOSIS — E1169 Type 2 diabetes mellitus with other specified complication: Secondary | ICD-10-CM | POA: Insufficient documentation

## 2018-06-17 DIAGNOSIS — E785 Hyperlipidemia, unspecified: Secondary | ICD-10-CM

## 2018-06-17 DIAGNOSIS — E876 Hypokalemia: Secondary | ICD-10-CM | POA: Insufficient documentation

## 2018-06-29 ENCOUNTER — Encounter (INDEPENDENT_AMBULATORY_CARE_PROVIDER_SITE_OTHER): Payer: Medicare Other | Admitting: Ophthalmology

## 2018-07-05 ENCOUNTER — Non-Acute Institutional Stay (SKILLED_NURSING_FACILITY): Payer: Medicare Other | Admitting: Internal Medicine

## 2018-07-05 ENCOUNTER — Encounter: Payer: Self-pay | Admitting: Internal Medicine

## 2018-07-05 DIAGNOSIS — K251 Acute gastric ulcer with perforation: Secondary | ICD-10-CM

## 2018-07-05 DIAGNOSIS — E1169 Type 2 diabetes mellitus with other specified complication: Secondary | ICD-10-CM

## 2018-07-05 DIAGNOSIS — I10 Essential (primary) hypertension: Secondary | ICD-10-CM

## 2018-07-05 DIAGNOSIS — E876 Hypokalemia: Secondary | ICD-10-CM | POA: Diagnosis not present

## 2018-07-05 DIAGNOSIS — I472 Ventricular tachycardia: Secondary | ICD-10-CM

## 2018-07-05 DIAGNOSIS — E871 Hypo-osmolality and hyponatremia: Secondary | ICD-10-CM

## 2018-07-05 DIAGNOSIS — I4729 Other ventricular tachycardia: Secondary | ICD-10-CM

## 2018-07-05 DIAGNOSIS — I441 Atrioventricular block, second degree: Secondary | ICD-10-CM

## 2018-07-05 DIAGNOSIS — Z8673 Personal history of transient ischemic attack (TIA), and cerebral infarction without residual deficits: Secondary | ICD-10-CM

## 2018-07-05 DIAGNOSIS — E785 Hyperlipidemia, unspecified: Secondary | ICD-10-CM

## 2018-07-05 MED ORDER — AMLODIPINE BESYLATE 5 MG PO TABS: 5.0000 mg | ORAL_TABLET | Freq: Every day | ORAL | 0 refills | Status: AC

## 2018-07-05 MED ORDER — VITAMIN D 25 MCG (1000 UNIT) PO TABS: 2000.0000 [IU] | ORAL_TABLET | Freq: Every day | ORAL | 0 refills | Status: AC

## 2018-07-05 MED ORDER — PRAVASTATIN SODIUM 20 MG PO TABS: 20.0000 mg | ORAL_TABLET | Freq: Every day | ORAL | 0 refills | Status: AC

## 2018-07-05 MED ORDER — LOSARTAN POTASSIUM 50 MG PO TABS: 50.0000 mg | ORAL_TABLET | Freq: Every day | ORAL | 0 refills | Status: AC

## 2018-07-05 MED ORDER — PANTOPRAZOLE SODIUM 40 MG PO TBEC: 40.0000 mg | DELAYED_RELEASE_TABLET | Freq: Two times a day (BID) | ORAL | 0 refills | Status: AC

## 2018-07-05 NOTE — Progress Notes (Signed)
[facility] Room Number: 111P Place of Service:  SNF (31)  Randon Goldsmithnne D. Lyn HollingsheadAlexander, MD  Patient Care Team: Kendell BaneBell, William, MD as PCP - General (Family Medicine) Chilton Siandolph, Tiffany, MD as PCP - Cardiology (Cardiology)  Extended Emergency Contact Information Primary Emergency Contact: Brown,Linda Address: [address]          [place] [id number] [place] Home Phone: [phone] Mobile Phone: [phone] Relation: Daughter Secondary Emergency Contact: Leola BrazilBrown, Kristin Mobile Phone: [phone] Relation: Granddaughter Preferred language: [place]  No Known Allergies  Chief Complaint  Patient presents with  . Discharge Note    [facility]    HPI:  85 y.o. female with history of CVA, diabetes mellitus type 2, who [facility] with complaints of 2 weeks of epigastric abdominal pain accompanied by nausea with self-induced emesis.  [facility]13 where she was found to have a gastric ulcer with contained perforation.  Patient opted for less aggressive approach after discussion with palliative care and does not want abdominal surgery.  Patient will be on PPI twice daily for suspected underlying gastric ulcer.  Patient is hypokalemia and hyponatremia were felt to be due to dehydration and were replaced without problem and patient's diarrhea early in the admission was felt to be secondary to her IV Zosyn.  Patient was admitted to skilled nursing facility for OT/PT and is now ready to be discharged home.    Past Medical History:  Diagnosis Date  . Abdominal pain 06/2018  . AVB (atrioventricular block)   . Cataract   . Complication of anesthesia    " The doctor told me not to be put tp sleep again, I dont know why "  . Diabetes mellitus without complication (HCC)   . Stroke [facility](HCC)     Past Surgical History:  Procedure Laterality Date  . ABDOMINAL HYSTERECTOMY     . CATARACT EXTRACTION, BILATERAL    . JOINT REPLACEMENT       reports that she has never smoked. She has never used smokeless tobacco. She reports previous alcohol use. She reports that she does not use drugs. Social History   Socioeconomic History  . Marital status: Widowed    Spouse name: Not on file  . Number of children: Not on file  . Years of education: Not on file  . Highest education level: Not on file  Occupational History  . Not on file  Social Needs  . Financial resource strain: Not on file  . Food insecurity:    Worry: Not on file    Inability: Not on file  . Transportation needs:    Medical: Not on file    Non-medical: Not on file  Tobacco Use  . Smoking status: Never Smoker  . Smokeless tobacco: Never Used  Substance and Sexual Activity  . Alcohol use: Not Currently  . Drug use: Never  . Sexual activity: Not on file  Lifestyle  . Physical activity:    Days per week: Not on file    Minutes per session: Not on file  . Stress: Not on file  Relationships  . Social connections:    Talks on phone: Not on file    Gets together: Not on file    Attends religious service: Not on file    Active member of club or organization: Not on file    Attends meetings of clubs or organizations: Not on  file    Relationship status: Not on file  . Intimate partner violence:    Fear of current or ex partner: Not on file    Emotionally abused: Not on file    Physically abused: Not on file    Forced sexual activity: Not on file  Other Topics Concern  . Not on file  Social History Narrative  . Not on file    Pertinent  Health Maintenance Due  Topic Date Due  . FOOT EXAM  04/14/1932  . OPHTHALMOLOGY EXAM  04/14/1934  . DEXA SCAN  04/14/1989  . PNA vac Low Risk Adult (1 of 2 - PCV13) 04/14/1989  . HEMOGLOBIN A1C  04/28/2010  . INFLUENZA VACCINE  12/31/2017    Medications: Allergies as of 07/05/2018   No Known Allergies     Medication List       Accurate as of  July 05, 2018 11:59 PM. Always use your most recent med list.        acetaminophen 325 MG tablet Commonly known as:  TYLENOL Take 2 tablets (650 mg total) by mouth every 6 (six) hours as needed for mild pain (or Fever >/= 101).   amLODipine 5 MG tablet Commonly known as:  NORVASC Take 1 tablet (5 mg total) by mouth daily.   aspirin 325 MG tablet Take 325 mg by mouth daily.   cholecalciferol 25 MCG (1000 UT) tablet Commonly known as:  VITAMIN D3 Take 2 tablets (2,000 Units total) by mouth daily.   losartan 50 MG tablet Commonly known as:  COZAAR Take 1 tablet (50 mg total) by mouth daily.   MULTI-VITAMIN PO Take 1 tablet by mouth daily. Reported on 05/28/2015   pantoprazole 40 MG tablet Commonly known as:  PROTONIX Take 1 tablet (40 mg total) by mouth 2 (two) times daily.   pravastatin 20 MG tablet Commonly known as:  PRAVACHOL Take 1 tablet (20 mg total) by mouth daily.   PRESERVISION AREDS PO Take by mouth 2 (two) times daily. Reported on 05/28/2015        Vitals:   07/05/18 1213  BP: (!) 125/58  Pulse: 64  Resp: 20  Temp: 99.2 F (37.3 C)  Weight: 144 lb (65.3 kg)  Height: 5\' 3"  (1.6 m)   Body mass index is 25.51 kg/m.  Physical Exam  GENERAL APPEARANCE: Alert, conversant. No acute distress.  HEENT: Unremarkable. RESPIRATORY: Breathing is even, unlabored. Lung sounds are clear   CARDIOVASCULAR: Heart RRR no murmurs, rubs or gallops. No peripheral edema.  GASTROINTESTINAL: Abdomen is soft, non-tender, not distended w/ normal bowel sounds.  NEUROLOGIC: Cranial nerves 2-12 grossly intact. Moves all extremities   Labs reviewed: Basic Metabolic Panel: Recent Labs    06/09/18 0511 06/10/18 0312 06/11/18 0359 06/12/18 0313 06/14/18 0302  NA 138 138 138 140 137  K 3.2* 3.2* 3.6 4.3 4.0  CL 108 108 109 112* 107  CO2 24 23 23 22 23   GLUCOSE 151* 138* 135* 129* 99  BUN 8 5* <5* 5* 7*  CREATININE 0.99 0.92 1.00 0.99 0.98  CALCIUM 8.3* 8.3* 8.1*  8.4* 8.5*  MG 1.8 1.7 2.1  --   --    No results found for: [facility] Liver Function Tests: Recent Labs    06/06/18 2356 06/09/18 0511  AST 14* 21  ALT 14 17  ALKPHOS 53 45  BILITOT 0.5 0.2*  PROT 7.4 5.7*  ALBUMIN 3.0* 2.1*   Recent Labs    06/06/18 2356  LIPASE 102*  No results for input(s): AMMONIA in the last 8760 hours. CBC: Recent Labs    06/06/18 2356 06/08/18 0331 06/09/18 0511  WBC 13.2* 6.7 7.7  HGB 10.4* 9.0* 9.3*  HCT 32.0* 28.8* 30.5*  MCV 92.8 95.7 97.4  PLT 438* 342 321   Lipid No results for input(s): CHOL, HDL, LDLCALC, TRIG in the last 8760 hours. Cardiac Enzymes: Recent Labs    06/07/18 1513 06/08/18 0842  TROPONINI <0.03 <0.03   BNP: No results for input(s): BNP in the last 8760 hours. CBG: Recent Labs    06/13/18 0654 06/13/18 1233 06/13/18 1639  GLUCAP 114* 160* 119*    Procedures and Imaging Studies During Stay: Ct Abdomen Pelvis W Contrast  Result Date: 06/07/2018 CLINICAL DATA:  85 year old with acute abdominal pain. Nausea and vomiting for weeks. EXAM: CT ABDOMEN AND PELVIS WITH CONTRAST TECHNIQUE: Multidetector CT imaging of the abdomen and pelvis was performed using the standard protocol following bolus administration of intravenous contrast. CONTRAST:  OMNIPAQUE IOHEXOL 300 MG/ML  SOLN COMPARISON:  Report from abdominal CT 03/31/2014 at an outside institution reviewed in Care Everywhere, images not available. FINDINGS: Lower chest: Ovoid paravertebral lesion at the right lung base is homogeneous attenuation with well-defined lobulated contours, extra vertebral component measuring 3.6 x 2.0 x 3.9 cm and extending into the right T11-T12 neural foramen with chronic osseous remottling. Component extends into the spinal canal. There is associated atelectasis in the right lower lobe. Coronary artery calcifications. Calcified noncalcified atheromatous plaque involving the descending thoracic aorta. Hepatobiliary: Focal hepatic  lesion. Gallbladder filled with stones. No pericholecystic inflammation. No biliary dilatation. Pancreas: No ductal dilatation or inflammation. Spleen: Normal in size without focal abnormality. Adrenals/Urinary Tract: Mild right adrenal thickening without dominant nodule. The left adrenal gland is normal. No hydronephrosis or perinephric edema. Homogeneous renal enhancement with symmetric excretion on delayed phase imaging. Small cyst in the upper left kidney. Urinary bladder is partially distended without wall thickening. Stomach/Bowel: Small hiatal hernia. There is a rounded outpouching of fluid from the lesser curvature of the stomach with the neck of 11 mm, image 23 series 3. Significant normal appendix. Moderate to large colonic stool burden. No colonic wall thickening or inflammatory change. Sigmoid diverticulosis without diverticulitis. Surrounding fat stranding and inflammatory change. Mild associated gastric wall thickening. The small bowel is decompressed without inflammatory change. Vascular/Lymphatic: Calcified noncalcified irregular plaque throughout the lower thoracic and abdominal aorta. No aneurysm. Mesenteric vessels appear patent. Portal vein is patent. There is prominent left adnexal vascularity and dilatation of the left ovarian vein to 6 mm. No adenopathy. Reproductive: Post hysterectomy. Prominent left adnexal vascularity with dilatation of the ovarian vein at 6 mm. No adnexal mass. Other: Small amount of free fluid in the pelvis. Suspect small amount of fluid in the lesser sac adjacent gastric inflammation. No free air or loculated abscess. Musculoskeletal: Multilevel degenerative change in the lumbar spine. Grade 1 anterolisthesis of L5 on S1 is likely facet mediated. Perivertebral mass in the lower thorax as described above. IMPRESSION: 1. Fluid-filled gastric outpouching from the lesser curvature. There is significant surrounding inflammatory change. Findings are suspicious for early or  contained gastric perforation, possible peptic ulcer disease, less likely gastric diverticulum which is inflamed. No extraluminal air. Recommend surgical consultation. 2. Chronic findings include gallstones and colonic diverticulosis without diverticulitis. Small hiatal hernia. 3. 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 This was described on outside prior exam. 4. Possible pelvic congestion. 5.  Aortic Atherosclerosis (ICD10-I70.0). These results  were called by telephone at the time of interpretation on 06/07/2018 at 3:09 am to Dr. Geoffery Lyons , who verbally acknowledged these results. Electronically Signed   By: Narda Rutherford M.D.   On: 06/07/2018 03:10   Dg Knee Right Port  Result Date: 06/09/2018 CLINICAL DATA:  Posterior right knee pain EXAM: PORTABLE RIGHT KNEE - 1-2 VIEW COMPARISON:  None FINDINGS: Advanced tricompartment degenerative changes with joint space narrowing and spurring. Small joint effusion. Rounded corticated calcifications inferior to the patella in the anterior soft tissues, likely related to old injury. No acute fracture, subluxation or dislocation. IMPRESSION: Advanced osteoarthritis. Small joint effusion. No acute bony abnormality. Electronically Signed   By: Charlett Nose M.D.   On: 06/09/2018 09:47   Dg Roselle Locus Scout & Delayed Images W Single Cm  Result Date: 06/11/2018 CLINICAL DATA:  85 year old female with perforated gastric ulcer noted on prior CT examination. Follow-up study. EXAM: WATER SOLUBLE UPPER GI SERIES TECHNIQUE: Single-column upper GI series was performed using water soluble contrast. CONTRAST:  OMNIPAQUE IOHEXOL 300 MG/ML  SOLN COMPARISON:  Upper GI 06/07/2018. CT the abdomen and pelvis 06/07/2018. FLUOROSCOPY TIME:  Fluoroscopy Time:  1 minutes and 6 seconds Radiation Exposure Index (if provided by the fluoroscopic device): 11.6 mGy FINDINGS: Limited single contrast upper GI again demonstrates the presence of a perforated ulcer along  the proximal aspect of the lesser curvature of the stomach which appears contained. No free-flowing contrast into the peritoneal cavity was identified. IMPRESSION: Similar appearance of contained perforation in the proximal aspect of the lesser curvature of the stomach, presumably from a perforated gastric ulcer. Electronically Signed   By: Trudie Reed M.D.   On: 06/11/2018 11:43   Dg Kayleen Memos W/water Sol Cm  Result Date: 06/07/2018 CLINICAL DATA:  Epigastric pain EXAM: WATER SOLUBLE UPPER GI SERIES TECHNIQUE: Single-column upper GI series was performed using water soluble contrast. CONTRAST:  OMNIPAQUE IOHEXOL 300 MG/ML  SOLN COMPARISON:  Abdominal CT from earlier the same day FLUOROSCOPY TIME:  Fluoroscopy Time:  2 minutes Radiation Exposure Index (if provided by the fluoroscopic device): 20.6 mGy Number of Acquired Spot Images: 6 FINDINGS: KUB shows a normal bowel gas pattern. There is excreting contrast in the collecting system from recent enhanced CT. Oblique pharyngeal imaging shows good oral coordination and airway protection. There a transient lateral pharyngeal outpouchings without stasis. The esophagus has normal distensibility and a smooth mucosal contour. There is a small transient sliding hiatal hernia. The stomach has normal shape and distensibility. A posterior wall ulcer is readily identified and measures up to 2.3 cm. No intraperitoneal or retroperitoneal leak is seen. No evident keeping at the margins, although this is a single contrast water soluble study. No gastric outlet obstruction. Duodenal fold pattern is unremarkable. Spontaneous gastroesophageal reflux to the thoracic inlet. IMPRESSION: 1. Deep gastric ulcer/contained perforation along the posterior wall of the stomach. 2. Prominent gastroesophageal reflux. Electronically Signed   By: Marnee Spring M.D.   On: 06/07/2018 08:01    Assessment/Plan:   Acute gastric ulcer with perforation (HCC)  Mobitz type 1 second degree  atrioventricular block  NSVT (nonsustained ventricular tachycardia) (HCC)  Hypokalemia  Benign essential HTN  Hyponatremia  H/O: CVA (cerebrovascular accident)  Hyperlipidemia associated with type 2 diabetes mellitus (HCC)   Patient is being discharged with the following home health services: OT/PT/nursing  Patient is being discharged with the following durable medical equipment:    Patient has been advised to f/u with their PCP in 1-2  weeks to bring them up to date on their rehab stay.  Social services at facility was responsible for arranging this appointment.  Pt was provided with a 30 day supply of prescriptions for medications and refills must be obtained from their PCP.  For controlled substances, a more limited supply may be provided adequate until PCP appointment only.  Medications have been reconciled.  Time spent greater than 30 minutes;> 50% of time with patient was spent reviewing records, labs, tests and studies, counseling and developing plan of care  Randon Goldsmith. Lyn Hollingshead, MD

## 2018-07-14 ENCOUNTER — Encounter (INDEPENDENT_AMBULATORY_CARE_PROVIDER_SITE_OTHER): Payer: Medicare Other | Admitting: Ophthalmology

## 2018-07-14 DIAGNOSIS — I10 Essential (primary) hypertension: Secondary | ICD-10-CM

## 2018-07-14 DIAGNOSIS — H35033 Hypertensive retinopathy, bilateral: Secondary | ICD-10-CM | POA: Diagnosis not present

## 2018-07-14 DIAGNOSIS — H43813 Vitreous degeneration, bilateral: Secondary | ICD-10-CM

## 2018-07-14 DIAGNOSIS — E11319 Type 2 diabetes mellitus with unspecified diabetic retinopathy without macular edema: Secondary | ICD-10-CM

## 2018-07-14 DIAGNOSIS — E113393 Type 2 diabetes mellitus with moderate nonproliferative diabetic retinopathy without macular edema, bilateral: Secondary | ICD-10-CM

## 2018-07-14 DIAGNOSIS — H353231 Exudative age-related macular degeneration, bilateral, with active choroidal neovascularization: Secondary | ICD-10-CM

## 2018-10-05 ENCOUNTER — Encounter (INDEPENDENT_AMBULATORY_CARE_PROVIDER_SITE_OTHER): Payer: Medicare Other | Admitting: Ophthalmology

## 2018-11-02 ENCOUNTER — Encounter (INDEPENDENT_AMBULATORY_CARE_PROVIDER_SITE_OTHER): Payer: Medicare Other | Admitting: Ophthalmology

## 2018-12-07 ENCOUNTER — Other Ambulatory Visit: Payer: Self-pay

## 2018-12-07 ENCOUNTER — Encounter (INDEPENDENT_AMBULATORY_CARE_PROVIDER_SITE_OTHER): Payer: Medicare Other | Admitting: Ophthalmology

## 2018-12-07 DIAGNOSIS — H35033 Hypertensive retinopathy, bilateral: Secondary | ICD-10-CM | POA: Diagnosis not present

## 2018-12-07 DIAGNOSIS — H43813 Vitreous degeneration, bilateral: Secondary | ICD-10-CM

## 2018-12-07 DIAGNOSIS — H353231 Exudative age-related macular degeneration, bilateral, with active choroidal neovascularization: Secondary | ICD-10-CM | POA: Diagnosis not present

## 2018-12-07 DIAGNOSIS — I10 Essential (primary) hypertension: Secondary | ICD-10-CM

## 2018-12-07 DIAGNOSIS — E113393 Type 2 diabetes mellitus with moderate nonproliferative diabetic retinopathy without macular edema, bilateral: Secondary | ICD-10-CM

## 2018-12-07 DIAGNOSIS — E11319 Type 2 diabetes mellitus with unspecified diabetic retinopathy without macular edema: Secondary | ICD-10-CM

## 2018-12-22 ENCOUNTER — Encounter (INDEPENDENT_AMBULATORY_CARE_PROVIDER_SITE_OTHER): Payer: Medicare Other | Admitting: Ophthalmology

## 2018-12-22 ENCOUNTER — Other Ambulatory Visit: Payer: Self-pay

## 2018-12-22 DIAGNOSIS — H353221 Exudative age-related macular degeneration, left eye, with active choroidal neovascularization: Secondary | ICD-10-CM

## 2019-03-15 ENCOUNTER — Other Ambulatory Visit: Payer: Self-pay

## 2019-03-15 ENCOUNTER — Encounter (INDEPENDENT_AMBULATORY_CARE_PROVIDER_SITE_OTHER): Payer: Medicare Other | Admitting: Ophthalmology

## 2019-03-15 DIAGNOSIS — H353231 Exudative age-related macular degeneration, bilateral, with active choroidal neovascularization: Secondary | ICD-10-CM

## 2019-03-15 DIAGNOSIS — H35033 Hypertensive retinopathy, bilateral: Secondary | ICD-10-CM

## 2019-03-15 DIAGNOSIS — H43813 Vitreous degeneration, bilateral: Secondary | ICD-10-CM

## 2019-03-15 DIAGNOSIS — I10 Essential (primary) hypertension: Secondary | ICD-10-CM

## 2019-06-21 ENCOUNTER — Encounter (INDEPENDENT_AMBULATORY_CARE_PROVIDER_SITE_OTHER): Payer: Medicare Other | Admitting: Ophthalmology

## 2019-06-21 DIAGNOSIS — E11319 Type 2 diabetes mellitus with unspecified diabetic retinopathy without macular edema: Secondary | ICD-10-CM

## 2019-06-21 DIAGNOSIS — E113392 Type 2 diabetes mellitus with moderate nonproliferative diabetic retinopathy without macular edema, left eye: Secondary | ICD-10-CM

## 2019-06-21 DIAGNOSIS — H353231 Exudative age-related macular degeneration, bilateral, with active choroidal neovascularization: Secondary | ICD-10-CM

## 2019-06-21 DIAGNOSIS — I10 Essential (primary) hypertension: Secondary | ICD-10-CM

## 2019-06-21 DIAGNOSIS — H35033 Hypertensive retinopathy, bilateral: Secondary | ICD-10-CM | POA: Diagnosis not present

## 2019-06-21 DIAGNOSIS — H43813 Vitreous degeneration, bilateral: Secondary | ICD-10-CM

## 2019-10-11 ENCOUNTER — Other Ambulatory Visit: Payer: Self-pay

## 2019-10-11 ENCOUNTER — Encounter (INDEPENDENT_AMBULATORY_CARE_PROVIDER_SITE_OTHER): Payer: Medicare Other | Admitting: Ophthalmology

## 2019-10-11 DIAGNOSIS — E113392 Type 2 diabetes mellitus with moderate nonproliferative diabetic retinopathy without macular edema, left eye: Secondary | ICD-10-CM

## 2019-10-11 DIAGNOSIS — H43813 Vitreous degeneration, bilateral: Secondary | ICD-10-CM

## 2019-10-11 DIAGNOSIS — H35033 Hypertensive retinopathy, bilateral: Secondary | ICD-10-CM

## 2019-10-11 DIAGNOSIS — E11319 Type 2 diabetes mellitus with unspecified diabetic retinopathy without macular edema: Secondary | ICD-10-CM

## 2019-10-11 DIAGNOSIS — H353231 Exudative age-related macular degeneration, bilateral, with active choroidal neovascularization: Secondary | ICD-10-CM

## 2019-10-11 DIAGNOSIS — I10 Essential (primary) hypertension: Secondary | ICD-10-CM | POA: Diagnosis not present

## 2020-02-21 ENCOUNTER — Other Ambulatory Visit: Payer: Self-pay

## 2020-02-21 ENCOUNTER — Encounter (INDEPENDENT_AMBULATORY_CARE_PROVIDER_SITE_OTHER): Payer: Medicare Other | Admitting: Ophthalmology

## 2020-02-21 DIAGNOSIS — H35033 Hypertensive retinopathy, bilateral: Secondary | ICD-10-CM

## 2020-02-21 DIAGNOSIS — E11311 Type 2 diabetes mellitus with unspecified diabetic retinopathy with macular edema: Secondary | ICD-10-CM | POA: Diagnosis not present

## 2020-02-21 DIAGNOSIS — E113392 Type 2 diabetes mellitus with moderate nonproliferative diabetic retinopathy without macular edema, left eye: Secondary | ICD-10-CM

## 2020-02-21 DIAGNOSIS — H353231 Exudative age-related macular degeneration, bilateral, with active choroidal neovascularization: Secondary | ICD-10-CM | POA: Diagnosis not present

## 2020-02-21 DIAGNOSIS — H43813 Vitreous degeneration, bilateral: Secondary | ICD-10-CM

## 2020-02-21 DIAGNOSIS — I10 Essential (primary) hypertension: Secondary | ICD-10-CM

## 2020-07-17 ENCOUNTER — Encounter (INDEPENDENT_AMBULATORY_CARE_PROVIDER_SITE_OTHER): Payer: Medicare Other | Admitting: Ophthalmology

## 2020-07-17 ENCOUNTER — Other Ambulatory Visit: Payer: Self-pay

## 2020-07-17 DIAGNOSIS — H35033 Hypertensive retinopathy, bilateral: Secondary | ICD-10-CM

## 2020-07-17 DIAGNOSIS — E113392 Type 2 diabetes mellitus with moderate nonproliferative diabetic retinopathy without macular edema, left eye: Secondary | ICD-10-CM

## 2020-07-17 DIAGNOSIS — I10 Essential (primary) hypertension: Secondary | ICD-10-CM

## 2020-07-17 DIAGNOSIS — H353231 Exudative age-related macular degeneration, bilateral, with active choroidal neovascularization: Secondary | ICD-10-CM | POA: Diagnosis not present

## 2020-07-17 DIAGNOSIS — H43813 Vitreous degeneration, bilateral: Secondary | ICD-10-CM

## 2020-12-18 ENCOUNTER — Encounter (INDEPENDENT_AMBULATORY_CARE_PROVIDER_SITE_OTHER): Payer: Medicare Other | Admitting: Ophthalmology

## 2020-12-18 ENCOUNTER — Other Ambulatory Visit: Payer: Self-pay

## 2020-12-18 DIAGNOSIS — I10 Essential (primary) hypertension: Secondary | ICD-10-CM | POA: Diagnosis not present

## 2020-12-18 DIAGNOSIS — H353231 Exudative age-related macular degeneration, bilateral, with active choroidal neovascularization: Secondary | ICD-10-CM

## 2020-12-18 DIAGNOSIS — H43813 Vitreous degeneration, bilateral: Secondary | ICD-10-CM | POA: Diagnosis not present

## 2020-12-18 DIAGNOSIS — H35033 Hypertensive retinopathy, bilateral: Secondary | ICD-10-CM | POA: Diagnosis not present

## 2021-05-21 ENCOUNTER — Encounter (INDEPENDENT_AMBULATORY_CARE_PROVIDER_SITE_OTHER): Payer: Medicare Other | Admitting: Ophthalmology

## 2021-06-04 ENCOUNTER — Encounter (INDEPENDENT_AMBULATORY_CARE_PROVIDER_SITE_OTHER): Payer: Medicare Other | Admitting: Ophthalmology

## 2021-06-24 ENCOUNTER — Emergency Department (HOSPITAL_BASED_OUTPATIENT_CLINIC_OR_DEPARTMENT_OTHER): Payer: Medicare Other

## 2021-06-24 ENCOUNTER — Other Ambulatory Visit: Payer: Self-pay

## 2021-06-24 ENCOUNTER — Inpatient Hospital Stay (HOSPITAL_BASED_OUTPATIENT_CLINIC_OR_DEPARTMENT_OTHER)
Admission: EM | Admit: 2021-06-24 | Discharge: 2021-07-03 | DRG: 951 | Disposition: E | Payer: Medicare Other | Attending: Family Medicine | Admitting: Family Medicine

## 2021-06-24 ENCOUNTER — Encounter (HOSPITAL_BASED_OUTPATIENT_CLINIC_OR_DEPARTMENT_OTHER): Payer: Self-pay | Admitting: *Deleted

## 2021-06-24 DIAGNOSIS — E119 Type 2 diabetes mellitus without complications: Secondary | ICD-10-CM | POA: Diagnosis present

## 2021-06-24 DIAGNOSIS — L89310 Pressure ulcer of right buttock, unstageable: Secondary | ICD-10-CM | POA: Diagnosis present

## 2021-06-24 DIAGNOSIS — N390 Urinary tract infection, site not specified: Secondary | ICD-10-CM | POA: Diagnosis present

## 2021-06-24 DIAGNOSIS — K449 Diaphragmatic hernia without obstruction or gangrene: Secondary | ICD-10-CM | POA: Diagnosis present

## 2021-06-24 DIAGNOSIS — G919 Hydrocephalus, unspecified: Secondary | ICD-10-CM | POA: Diagnosis present

## 2021-06-24 DIAGNOSIS — J9811 Atelectasis: Secondary | ICD-10-CM | POA: Diagnosis present

## 2021-06-24 DIAGNOSIS — Z7982 Long term (current) use of aspirin: Secondary | ICD-10-CM | POA: Diagnosis not present

## 2021-06-24 DIAGNOSIS — Z515 Encounter for palliative care: Principal | ICD-10-CM

## 2021-06-24 DIAGNOSIS — Z8673 Personal history of transient ischemic attack (TIA), and cerebral infarction without residual deficits: Secondary | ICD-10-CM

## 2021-06-24 DIAGNOSIS — G9341 Metabolic encephalopathy: Secondary | ICD-10-CM | POA: Diagnosis present

## 2021-06-24 DIAGNOSIS — Z66 Do not resuscitate: Secondary | ICD-10-CM | POA: Diagnosis present

## 2021-06-24 DIAGNOSIS — I10 Essential (primary) hypertension: Secondary | ICD-10-CM | POA: Diagnosis present

## 2021-06-24 DIAGNOSIS — Z20822 Contact with and (suspected) exposure to covid-19: Secondary | ICD-10-CM | POA: Diagnosis present

## 2021-06-24 DIAGNOSIS — I609 Nontraumatic subarachnoid hemorrhage, unspecified: Secondary | ICD-10-CM | POA: Diagnosis present

## 2021-06-24 DIAGNOSIS — E860 Dehydration: Secondary | ICD-10-CM | POA: Diagnosis present

## 2021-06-24 DIAGNOSIS — E876 Hypokalemia: Secondary | ICD-10-CM | POA: Diagnosis present

## 2021-06-24 DIAGNOSIS — I4891 Unspecified atrial fibrillation: Secondary | ICD-10-CM | POA: Diagnosis present

## 2021-06-24 DIAGNOSIS — E785 Hyperlipidemia, unspecified: Secondary | ICD-10-CM | POA: Diagnosis present

## 2021-06-24 DIAGNOSIS — N3000 Acute cystitis without hematuria: Secondary | ICD-10-CM

## 2021-06-24 DIAGNOSIS — K8020 Calculus of gallbladder without cholecystitis without obstruction: Secondary | ICD-10-CM | POA: Diagnosis present

## 2021-06-24 DIAGNOSIS — H9190 Unspecified hearing loss, unspecified ear: Secondary | ICD-10-CM | POA: Diagnosis present

## 2021-06-24 DIAGNOSIS — I615 Nontraumatic intracerebral hemorrhage, intraventricular: Secondary | ICD-10-CM | POA: Diagnosis present

## 2021-06-24 DIAGNOSIS — Z79899 Other long term (current) drug therapy: Secondary | ICD-10-CM | POA: Diagnosis not present

## 2021-06-24 DIAGNOSIS — R112 Nausea with vomiting, unspecified: Secondary | ICD-10-CM

## 2021-06-24 LAB — CBC WITH DIFFERENTIAL/PLATELET
Abs Immature Granulocytes: 0.09 10*3/uL — ABNORMAL HIGH (ref 0.00–0.07)
Basophils Absolute: 0 10*3/uL (ref 0.0–0.1)
Basophils Relative: 0 %
Eosinophils Absolute: 0 10*3/uL (ref 0.0–0.5)
Eosinophils Relative: 0 %
HCT: 41.6 % (ref 36.0–46.0)
Hemoglobin: 14 g/dL (ref 12.0–15.0)
Immature Granulocytes: 1 %
Lymphocytes Relative: 4 %
Lymphs Abs: 0.7 10*3/uL (ref 0.7–4.0)
MCH: 31.9 pg (ref 26.0–34.0)
MCHC: 33.7 g/dL (ref 30.0–36.0)
MCV: 94.8 fL (ref 80.0–100.0)
Monocytes Absolute: 1.2 10*3/uL — ABNORMAL HIGH (ref 0.1–1.0)
Monocytes Relative: 6 %
Neutro Abs: 16.2 10*3/uL — ABNORMAL HIGH (ref 1.7–7.7)
Neutrophils Relative %: 89 %
Platelets: 280 10*3/uL (ref 150–400)
RBC: 4.39 MIL/uL (ref 3.87–5.11)
RDW: 13.1 % (ref 11.5–15.5)
WBC: 18.1 10*3/uL — ABNORMAL HIGH (ref 4.0–10.5)
nRBC: 0 % (ref 0.0–0.2)

## 2021-06-24 LAB — COMPREHENSIVE METABOLIC PANEL
ALT: 41 U/L (ref 0–44)
AST: 39 U/L (ref 15–41)
Albumin: 3.7 g/dL (ref 3.5–5.0)
Alkaline Phosphatase: 69 U/L (ref 38–126)
Anion gap: 13 (ref 5–15)
BUN: 30 mg/dL — ABNORMAL HIGH (ref 8–23)
CO2: 27 mmol/L (ref 22–32)
Calcium: 9.7 mg/dL (ref 8.9–10.3)
Chloride: 97 mmol/L — ABNORMAL LOW (ref 98–111)
Creatinine, Ser: 0.96 mg/dL (ref 0.44–1.00)
GFR, Estimated: 54 mL/min — ABNORMAL LOW (ref >60)
Glucose, Bld: 218 mg/dL — ABNORMAL HIGH (ref 70–99)
Potassium: 3.1 mmol/L — ABNORMAL LOW (ref 3.5–5.1)
Sodium: 137 mmol/L (ref 135–145)
Total Bilirubin: 1.1 mg/dL (ref 0.3–1.2)
Total Protein: 8 g/dL (ref 6.5–8.1)

## 2021-06-24 LAB — URINALYSIS, MICROSCOPIC (REFLEX)

## 2021-06-24 LAB — URINALYSIS, ROUTINE W REFLEX MICROSCOPIC
Bilirubin Urine: NEGATIVE
Glucose, UA: NEGATIVE mg/dL
Ketones, ur: NEGATIVE mg/dL
Leukocytes,Ua: NEGATIVE
Nitrite: NEGATIVE
Protein, ur: >300 mg/dL — AB
Specific Gravity, Urine: >1.03 — ABNORMAL HIGH (ref 1.005–1.030)
pH: 6 (ref 5.0–8.0)

## 2021-06-24 LAB — RESP PANEL BY RT-PCR (FLU A&B, COVID) ARPGX2
Influenza A by PCR: NEGATIVE
Influenza B by PCR: NEGATIVE
SARS Coronavirus 2 by RT PCR: NEGATIVE

## 2021-06-24 LAB — MAGNESIUM: Magnesium: 1.9 mg/dL (ref 1.7–2.4)

## 2021-06-24 MED ORDER — ACETAMINOPHEN 325 MG PO TABS: 650.0000 mg | ORAL_TABLET | Freq: Four times a day (QID) | ORAL | Status: DC | PRN

## 2021-06-24 MED ORDER — MORPHINE SULFATE (PF) 2 MG/ML IV SOLN: 1.0000 mg | INTRAVENOUS | Status: DC | PRN

## 2021-06-24 MED ORDER — AMLODIPINE BESYLATE 5 MG PO TABS
5.0000 mg | ORAL_TABLET | Freq: Once | ORAL | Status: AC
Start: 2021-06-24 — End: 2021-06-24
  Administered 2021-06-24: 5 mg via ORAL
  Filled 2021-06-24: qty 1

## 2021-06-24 MED ORDER — HALOPERIDOL LACTATE 2 MG/ML PO CONC
0.5000 mg | ORAL | Status: DC | PRN
  Filled 2021-06-24: qty 0.3

## 2021-06-24 MED ORDER — LORAZEPAM 2 MG/ML IJ SOLN: 1.0000 mg | INTRAMUSCULAR | Status: DC | PRN

## 2021-06-24 MED ORDER — SODIUM CHLORIDE 0.9 % IV BOLUS
500.0000 mL | Freq: Once | INTRAVENOUS | Status: AC
  Administered 2021-06-24: 500 mL via INTRAVENOUS

## 2021-06-24 MED ORDER — ACETAMINOPHEN 325 MG PO TABS
650.0000 mg | ORAL_TABLET | Freq: Once | ORAL | Status: AC
Start: 2021-06-24 — End: 2021-06-24
  Administered 2021-06-24: 650 mg via ORAL
  Filled 2021-06-24: qty 2

## 2021-06-24 MED ORDER — MORPHINE SULFATE (CONCENTRATE) 10 MG/0.5ML PO SOLN: 5.0000 mg | ORAL | Status: DC | PRN

## 2021-06-24 MED ORDER — ONDANSETRON HCL 4 MG/2ML IJ SOLN: 4.0000 mg | Freq: Four times a day (QID) | INTRAMUSCULAR | Status: DC | PRN

## 2021-06-24 MED ORDER — GLYCOPYRROLATE 0.2 MG/ML IJ SOLN: 0.2000 mg | INTRAMUSCULAR | Status: DC | PRN

## 2021-06-24 MED ORDER — LOSARTAN POTASSIUM 25 MG PO TABS
50.0000 mg | ORAL_TABLET | Freq: Once | ORAL | Status: AC
  Administered 2021-06-24: 50 mg via ORAL
  Filled 2021-06-24: qty 2

## 2021-06-24 MED ORDER — ONDANSETRON HCL 4 MG/2ML IJ SOLN
4.0000 mg | Freq: Once | INTRAMUSCULAR | Status: AC
  Administered 2021-06-24: 4 mg via INTRAVENOUS
  Filled 2021-06-24: qty 2

## 2021-06-24 MED ORDER — SODIUM CHLORIDE 0.9 % IV SOLN
1.0000 g | Freq: Once | INTRAVENOUS | Status: AC
  Administered 2021-06-24: 1 g via INTRAVENOUS
  Filled 2021-06-24: qty 10

## 2021-06-24 MED ORDER — GLYCOPYRROLATE 1 MG PO TABS
1.0000 mg | ORAL_TABLET | ORAL | Status: DC | PRN
  Filled 2021-06-24: qty 1

## 2021-06-24 MED ORDER — IOHEXOL 300 MG/ML  SOLN
100.0000 mL | Freq: Once | INTRAMUSCULAR | Status: AC | PRN
  Administered 2021-06-24: 100 mL via INTRAVENOUS

## 2021-06-24 MED ORDER — HALOPERIDOL LACTATE 5 MG/ML IJ SOLN: 0.5000 mg | INTRAMUSCULAR | Status: DC | PRN

## 2021-06-24 MED ORDER — ONDANSETRON 4 MG PO TBDP: 4.0000 mg | ORAL_TABLET | Freq: Four times a day (QID) | ORAL | Status: DC | PRN

## 2021-06-24 MED ORDER — LORAZEPAM 2 MG/ML PO CONC: 1.0000 mg | ORAL | Status: DC | PRN

## 2021-06-24 MED ORDER — LORAZEPAM 1 MG PO TABS: 1.0000 mg | ORAL_TABLET | ORAL | Status: DC | PRN

## 2021-06-24 MED ORDER — HALOPERIDOL 0.5 MG PO TABS
0.5000 mg | ORAL_TABLET | ORAL | Status: DC | PRN
  Filled 2021-06-24: qty 1

## 2021-06-24 MED ORDER — ACETAMINOPHEN 650 MG RE SUPP: 650.0000 mg | Freq: Four times a day (QID) | RECTAL | Status: DC | PRN

## 2021-06-24 MED ORDER — POTASSIUM CHLORIDE CRYS ER 20 MEQ PO TBCR
40.0000 meq | EXTENDED_RELEASE_TABLET | Freq: Once | ORAL | Status: AC
  Administered 2021-06-24: 40 meq via ORAL
  Filled 2021-06-24: qty 2

## 2021-06-24 MED ORDER — BIOTENE DRY MOUTH MT LIQD: 15.0000 mL | OROMUCOSAL | Status: DC | PRN

## 2021-06-24 MED ORDER — POLYVINYL ALCOHOL 1.4 % OP SOLN
1.0000 [drp] | Freq: Four times a day (QID) | OPHTHALMIC | Status: DC | PRN
  Filled 2021-06-24: qty 15

## 2021-06-24 NOTE — ED Notes (Signed)
SpO2 86-89% on r/a, no distress noted, probe changed th right index finger, placed on 2LPM [place] SpO2 93%.

## 2021-06-24 NOTE — ED Notes (Signed)
Called lab to add on urine culture at 12:09.

## 2021-06-24 NOTE — H&P (Signed)
History and Physical    Briana Martinez [id number] DOB: Oct 27, 1921 DOA: 06/04/2021  PCP: [name], MD  Patient coming from: [place] [facility]  I have personally briefly reviewed patient's old medical records in [place]  Chief Complaint: Nausea, weakness  HPI: Briana Martinez is a 88 y.o. female with medical history significant for history of CVA, Mobitz type II heart block, hypertension, hyperlipidemia, hard of hearing who presented to [place] ED for evaluation of nausea and weakness.  Patient unable to provide history due to encephalopathy/hypersomnolence and is otherwise obtained by chart review and patient's granddaughter in law at bedside.  Family states that patient was living alone up until 3 weeks ago when family brought her to live with them locally.  2 days ago patient began complaining of neck pain.  She has been having nausea and vomiting.  She has been feeling generally weak.  Today she committed family that her symptoms were worsening and that she needed further medical attention.  Family subsequently took patient to [name] for further evaluation.  Patient seen on the floor after transfer to [name].  She is asleep and difficult to arouse with some agonal breathing.  She moves her right upper extremity spontaneously and keeps her left hand gripped on the bed remote but otherwise not communicative or following commands.  Patient's granddaughter-in-law is at bedside and confirms that patient is DNR/DNI and that she or family would not want any aggressive intervention.  Goal is comfort based as they acknowledge that she is approaching end-of-life.  [name] Course:  Initial vitals showed BP 182/62, pulse 68, RR 20, temp 99.5 F, SPO2 94% on room air.  While in the ED, SPO2 dropped to 86-89% on room air and she was placed on 2 L supplemental O2 via [place] with improvement.  Labs show WBC 18.1, hemoglobin  14.0, platelets 280,000, sodium 137, potassium 3.1, bicarb 27, BUN 30, creatinine 0.96, serum glucose 218, LFTs within normal limits, magnesium 1.9.  Urinalysis shows >300 protein, negative nitrates, negative leukocytes, 11-20 RBCs and WBC/hpf, many bacteria on microscopy.  Urine culture was obtained and pending.  SARS-CoV-2 and influenza PCR negative.  CT cervical spine showed changes concerning for blood products within the subdural space and occipital horns.  No cervical spine fracture seen.  CT head without contrast showed large volume subarachnoid hemorrhage at the skull base with intraventricular hemorrhage and hydrocephalus.  CT abdomen/pelvis with contrast negative for acute abnormality.  Moderate size hiatal hernia, cholelithiasis without cholecystitis, sigmoid diverticulosis without diverticulitis, cardiomegaly and right lower lobe atelectasis noted.  Stable right T11-12 spinal and paraspinal mass seen, felt compatible with a neurogenic neoplasm.  Patient was given IV ceftriaxone, normal saline 500 mL bolus x2, 40 mill equivalents of oral potassium.  Per EDP discussion with family, patient is DNR and she would not want any surgery or intervention for the subarachnoid hemorrhage.  The hospitalist service was consulted to admit for further management.  Review of Systems:  Unable to obtain full review of systems as patient is noncommunicative.   Past Medical History:  Diagnosis Date   Abdominal pain 06/2018   AVB (atrioventricular block)    Cataract    Complication of anesthesia    " The doctor told me not to be put tp sleep again, I dont know why "   Diabetes mellitus without complication ([place])    Stroke ([place])  Past Surgical History:  Procedure Laterality Date   ABDOMINAL HYSTERECTOMY     CATARACT EXTRACTION, BILATERAL     JOINT REPLACEMENT      Social History:  reports that she has never smoked. She has never used smokeless tobacco. She reports that she does not  currently use alcohol. She reports that she does not use drugs.  No Known Allergies  History reviewed. No pertinent family history.   Prior to Admission medications   Medication Sig Start Date End Date Taking? Authorizing Provider  acetaminophen (TYLENOL) 325 MG tablet Take 2 tablets (650 mg total) by mouth every 6 (six) hours as needed for mild pain (or Fever >/= 101). 06/14/18  Yes Debbe Odea, MD  amLODipine (NORVASC) 5 MG tablet Take 1 tablet (5 mg total) by mouth daily. 07/05/18  Yes Hennie Duos, MD  aspirin 325 MG tablet Take 325 mg by mouth daily.   Yes [provider]  cholecalciferol (VITAMIN D3) 25 MCG (1000 UT) tablet Take 2 tablets (2,000 Units total) by mouth daily. 07/05/18  Yes Hennie Duos, MD  losartan (COZAAR) 50 MG tablet Take 1 tablet (50 mg total) by mouth daily. 07/05/18  Yes Hennie Duos, MD  Multiple Vitamin (MULTI-VITAMIN PO) Take 1 tablet by mouth daily. Reported on 05/28/2015   Yes [provider]  Multiple Vitamins-Minerals (PRESERVISION AREDS PO) Take 1 tablet by mouth 2 (two) times daily.   Yes [provider]  pantoprazole (PROTONIX) 40 MG tablet Take 1 tablet (40 mg total) by mouth 2 (two) times daily. 07/05/18  Yes Hennie Duos, MD  pravastatin (PRAVACHOL) 20 MG tablet Take 1 tablet (20 mg total) by mouth daily. Patient not taking: Reported on 06/12/2021 07/05/18   Hennie Duos, MD    Physical Exam: Vitals:   06/28/2021 1400 06/19/2021 1605 06/20/2021 1823 06/08/2021 2015  BP: (!) 173/65 (!) 153/44 (!) 156/71 (!) 162/81  Pulse: 64 68 68 82  Resp: (!) 26 (!) 26  (!) 27  Temp:   99.1 F (37.3 C) 98.4 F (36.9 C)  TempSrc:   Oral Oral  SpO2: 98% 95% 95% 92%  Weight:      Height:      Exam limited due to hypersomnolence. Constitutional: Elderly woman resting in bed with head elevated Eyes: Keeps eyes closed ENMT: Mucous membranes are dry. Posterior pharynx clear of any exudate or lesions. Neck: normal, supple, no  masses. Respiratory: clear to auscultation anteriorly. Cardiovascular: Irregular. No extremity edema. Abdomen: no apparent tenderness. Musculoskeletal: Moves RUE spontaneously, grips bed remote repeatedly with left hand Skin: no rashes, lesions, ulcers. No induration Neurologic: Moves RUE spontaneously, grips bed remote  Psychiatric: Difficult to arouse  Labs on Admission: I have personally reviewed following labs and imaging studies  CBC: Recent Labs  Lab 06/13/2021 0858  WBC 18.1*  NEUTROABS 16.2*  HGB 14.0  HCT 41.6  MCV 94.8  PLT 123456   Basic Metabolic Panel: Recent Labs  Lab 06/05/2021 0858  NA 137  K 3.1*  CL 97*  CO2 27  GLUCOSE 218*  BUN 30*  CREATININE 0.96  CALCIUM 9.7  MG 1.9   GFR: Estimated Creatinine Clearance: 34.2 mL/min (by C-G formula based on SCr of 0.96 mg/dL). Liver Function Tests: Recent Labs  Lab 06/22/2021 0858  AST 39  ALT 41  ALKPHOS 69  BILITOT 1.1  PROT 8.0  ALBUMIN 3.7   No results for input(s): LIPASE, AMYLASE in the last 168 hours. No results for input(s):  AMMONIA in the last 168 hours. Coagulation Profile: No results for input(s): INR, PROTIME in the last 168 hours. Cardiac Enzymes: No results for input(s): CKTOTAL, CKMB, CKMBINDEX, TROPONINI in the last 168 hours. BNP (last 3 results) No results for input(s): PROBNP in the last 8760 hours. HbA1C: No results for input(s): HGBA1C in the last 72 hours. CBG: No results for input(s): GLUCAP in the last 168 hours. Lipid Profile: No results for input(s): CHOL, HDL, LDLCALC, TRIG, CHOLHDL, LDLDIRECT in the last 72 hours. Thyroid Function Tests: No results for input(s): TSH, T4TOTAL, FREET4, T3FREE, THYROIDAB in the last 72 hours. Anemia Panel: No results for input(s): VITAMINB12, FOLATE, FERRITIN, TIBC, IRON, RETICCTPCT in the last 72 hours. Urine analysis:    Component Value Date/Time   COLORURINE YELLOW 06/30/2021 0859   APPEARANCEUR CLOUDY (A) 06/25/2021 0859   LABSPEC  >1.030 (H) 06/04/2021 0859   PHURINE 6.0 06/22/2021 0859   GLUCOSEU NEGATIVE 06/07/2021 0859   HGBUR MODERATE (A) 07/01/2021 0859   BILIRUBINUR NEGATIVE 06/07/2021 0859   KETONESUR NEGATIVE 06/10/2021 0859   PROTEINUR >300 (A) 06/25/2021 0859   NITRITE NEGATIVE 06/22/2021 0859   LEUKOCYTESUR NEGATIVE 06/18/2021 0859    Radiological Exams on Admission: CT Head Wo Contrast  Result Date: 06/20/2021 CLINICAL DATA:  Headache, new or worsening (Age >= 50y) EXAM: CT HEAD WITHOUT CONTRAST TECHNIQUE: Contiguous axial images were obtained from the base of the skull through the vertex without intravenous contrast. RADIATION DOSE REDUCTION: This exam was performed according to the departmental dose-optimization program which includes automated exposure control, adjustment of the mA and/or kV according to patient size and/or use of iterative reconstruction technique. COMPARISON:  2011 FINDINGS: Brain: Subarachnoid hemorrhage is present involving the basal cisterns with extension into cortical sulci. There is intraventricular hemorrhage layering in the occipital horns, third ventricle, and fourth ventricle. Disproportionate prominence of the ventricles likely reflects hydrocephalus. Periventricular white matter hypoattenuation could reflect a component of associated interstitial edema. Chronic right parietal infarct with some posterior frontal involvement as well as occipital and posterior temporal involvement. Probable chronic microvascular ischemic changes in the cerebral white matter. Age-indeterminate, probable chronic small vessel infarct of the left corona radiata and basal ganglia. Vascular: There is atherosclerotic calcification at the skull base. Skull: Calvarium is unremarkable. Sinuses/Orbits: No acute finding. Other: None. IMPRESSION: Large volume subarachnoid hemorrhage at the skull base. Intraventricular hemorrhage is present. Hydrocephalus is present. CTA is recommended to evaluate for aneurysm. These  results were called by telephone at the time of interpretation on 06/19/2021 at 2:03 pm to provider [facility] , who verbally acknowledged these results. Electronically Signed   By: Macy Mis M.D.   On: 06/27/2021 14:08   CT Cervical Spine Wo Contrast  Result Date: 06/19/2021 CLINICAL DATA:  Neck trauma. Severe neck pain ever since patient started vomiting 06/20/2021. EXAM: CT CERVICAL SPINE WITHOUT CONTRAST TECHNIQUE: Multidetector CT imaging of the cervical spine was performed without intravenous contrast. Multiplanar CT image reconstructions were also generated. RADIATION DOSE REDUCTION: This exam was performed according to the departmental dose-optimization program which includes automated exposure control, adjustment of the mA and/or kV according to patient size and/or use of iterative reconstruction technique. COMPARISON:  None. FINDINGS: Alignment: 2 mm grade 1 anterolisthesis of C7 on T1. 1-2 mm grade 1 anterolisthesis of C3 on C4. The facet joints are appropriately aligned. Skull base and vertebrae: The atlantodens interval is intact with moderate degenerative change. There is mild scalloping of the anterior superior C4 through C6 endplates with well corticated  margins, and this appears chronic. Moderate posterior C5-6, moderate to severe diffuse C6-7, and severe diffuse C7-T1, T1-2 and T2-3 disc space narrowing. Moderate endplate osteophytosis greatest at C7-T1 through T2-3. Soft tissues and spinal canal: There is layering density within the partially visualized bilateral occipital horns (axial series 3, image 1/96), which may be secondary to mild layering acute to subacute blood products within the lateral ventricles. Consider CT of the brain for further evaluation. There also appears to be increased density within the tentorium cerebelli and midline falx which may represent subdural blood. Per discussion with the ordering clinician, the patient did recently have IV contrast for a CT of the  abdomen and pelvis, and this may be the etiology for the increased density within the falx and tentorium cerebella, however is not definitively the cause for the density within the dependent lateral ventricles. Disc levels: C2-3: Moderate left facet joint and uncovertebral hypertrophy with mild left neural foraminal stenosis. C3-4: Right facet joint and uncovertebral hypertrophy contribute to mild right neural foraminal stenosis and mild right lateral recess stenosis. C4-5: Severe right and moderate left facet joint hypertrophy. Right-greater-than-left uncovertebral hypertrophy. Mild right neural foraminal stenosis. C5-6: Moderate right-greater-than-left facet joint and uncovertebral hypertrophy. Moderate to severe bilateral neural foraminal stenosis. Moderate narrowing of lateral recesses. Mild central canal stenosis. C6-7: Moderate bilateral facet joint and ligamentum flavum hypertrophy. Moderate central canal and neural foraminal stenosis. C7-T1: Grade 1 anterolisthesis. Mild broad-based posterior disc osteophyte complex. Moderate bilateral facet joint hypertrophy. Mild central canal and left greater than right neural foraminal stenosis. The lung apices are grossly unremarkable. Upper chest: There is a nodular density within the posterior upper back soft tissues just to the left of midline, centered within the subcutaneous fat just deep to the skin surface at the superior T1 level measuring up to 2.5 x 1.6 cm (transverse by AP). At the peripheral aspect this measures dense fluid density in the central aspect there is increased density which may represent soft tissue and/or enhancement from recent abdominal CT with IV contrast. This is nonspecific. It may represent a mildly complex sebaceous cyst. Recommend clinical correlation. Other: None. IMPRESSION: 1. There is increased density within the tentorium cerebelli and interhemispheric falx that at first raises the question of blood products within the subdural  space, however this may be secondary to IV contrast from CT abdomen and pelvis performed contemporaneously. It is unclear whether layering density within the partially visualized occipital horns relates to this contemporaneous IV contrast administration. Cannot exclude acute to subacute blood products within the occipital horns. Consider head CT for further evaluation. 2. No acute fracture is seen within the cervical spine. 3. Multilevel degenerative disc and joint changes as above. Critical Value/emergent results were called by telephone at the time of interpretation on 06/17/2021 at 1:28 pm to provider JULIE HAVILAND , who verbally acknowledged these results. Electronically Signed   By: Yvonne Kendall M.D.   On: 06/27/2021 13:39   CT ABDOMEN PELVIS W CONTRAST  Result Date: 06/16/2021 CLINICAL DATA:  Nausea and vomiting.  Foul smelling urine. EXAM: CT ABDOMEN AND PELVIS WITH CONTRAST TECHNIQUE: Multidetector CT imaging of the abdomen and pelvis was performed using the standard protocol following bolus administration of intravenous contrast. RADIATION DOSE REDUCTION: This exam was performed according to the departmental dose-optimization program which includes automated exposure control, adjustment of the mA and/or kV according to patient size and/or use of iterative reconstruction technique. CONTRAST:  187mL OMNIPAQUE IOHEXOL 300 MG/ML  SOLN COMPARISON:  06/07/2018 FINDINGS: Lower  chest: Enlarged heart with an interval increase in size. Atheromatous calcifications, including the coronary arteries and aorta. Moderate-sized, fluid-filled hiatal hernia with an interval increase in size. The distal esophagus is also mildly dilated and fluid-filled as is the proximal stomach. Hepatobiliary: Multiple partially calcified gallstones in the gallbladder, the largest measuring approximately 1.3 cm in maximum diameter. No gallbladder wall thickening or pericholecystic fluid. Unremarkable liver. Pancreas: Moderate diffuse  pancreatic atrophy. Spleen: Normal in size without focal abnormality. Adrenals/Urinary Tract: Normal appearing adrenal glands. Slight increase in size of a simple appearing small upper pole left renal cyst. Unremarkable right kidney, ureters and urinary bladder. Stomach/Bowel: Moderate-sized, fluid-filled hiatal hernia with an interval increase in size. The distal esophagus is also mildly dilated and fluid-filled as is the proximal stomach. Small number of sigmoid colon diverticula without evidence of diverticulitis. Unremarkable small bowel and appendix. Vascular/Lymphatic: Atheromatous arterial calcifications without aneurysm. No enlarged lymph nodes. Reproductive: Status post hysterectomy. Normal appearing ovaries. No adnexal masses. Other: Mild diffuse subcutaneous edema. Musculoskeletal: Spinal and paraspinal mass measuring 5.0 x 2.5 cm on image number 17/2, expanding the right T11-12 neural foramen with chronic enlargement of the foramen. This previously measured approximately 5.1 x 2.6 cm in corresponding dimensions. Extensive lumbar and lower thoracic spine degenerative changes. IMPRESSION: 1. No acute abnormality in the abdomen or pelvis. 2. Moderate-sized hiatal hernia with evidence of gastroesophageal reflux. 3. Cholelithiasis without evidence of cholecystitis. 4. Sigmoid diverticulosis without evidence of diverticulitis. 5. Interval cardiomegaly and right lower lobe atelectasis. 6. Stable right T11-12 spinal and paraspinal mass measuring approximately 5.0 x 2.5 cm with chronic widening of the right T11-12 neural foramen and erosion of the adjacent transverse process anteriorly. This remains compatible with a neurogenic neoplasm. 7. Mild diffuse subcutaneous edema. 8.  Calcific coronary artery and aortic atherosclerosis. Electronically Signed   By: Claudie Revering M.D.   On: 06/03/2021 13:20    EKG: Personally reviewed.  Indeterminant, difficult to clearly identify regular P waves, could be second-degree  AV block.  Prior EKG showed Sinus bradycardia with type I second-degree AV block.  Assessment/Plan Principal Problem:   End of life care Active Problems:   Subarachnoid hemorrhage (HCC)   H/O: CVA (cerebrovascular accident)   Benign essential HTN   Eliese Shoji Markson is a 88 y.o. female with medical history significant for history of CVA, Mobitz type II heart block, hypertension, hyperlipidemia, hard of hearing who is admitted for end-of-life care in the setting of subarachnoid hemorrhage.  * End of life care Admitted for end-of-life care in setting of large volume subarachnoid hemorrhage associated with intraventricular hemorrhage and hydrocephalus.  Family are clear that patient is DNR/DNI and goal is comfort based with anticipation of in-hospital death versus transition to hospice. -Comfort care measures only -Oral and IV morphine as needed for pain -Haldol as needed for agitation/delirium, Ativan as needed for anxiety -No further blood draws, IV fluids, or antibiotics   DVT prophylaxis: None Code Status: DNR/DNI, comfort care measures Family Communication: Discussed with patient's granddaughter in law at bedside Disposition Plan: Discharge to hospice versus in-hospital death Consults called: None Level of care: Palliative Care Admission status:  Status is: Inpatient  Remains inpatient appropriate because: Admitted for end-of-life care.  Zada Finders MD [facility]  If 7PM-7AM, please contact night-coverage [email or web address]  06/16/2021, 11:23 PM

## 2021-06-24 NOTE — Assessment & Plan Note (Signed)
Admitted for end-of-life care in setting of large volume subarachnoid hemorrhage associated with intraventricular hemorrhage and hydrocephalus.  Family are clear that patient is DNR/DNI and goal is comfort based with anticipation of in-hospital death versus transition to hospice. -Comfort care measures only -Oral and IV morphine as needed for pain -Haldol as needed for agitation/delirium, Ativan as needed for anxiety -No further blood draws, IV fluids, or antibiotics

## 2021-06-24 NOTE — ED Triage Notes (Signed)
Family states she has just not felt well since Thursday, having nausea and some vomiting, urine is very foul smelling as well. Pt appears not well.

## 2021-06-24 NOTE — ED Provider Notes (Signed)
[facility] EMERGENCY DEPARTMENT Provider Note   CSN: [id number] Arrival date & time: 06/21/2021  [id number]     History  Chief Complaint  Patient presents with   Nausea   Weakness    Briana Martinez is a 88 y.o. female.  Pt is a 89 yo wf with a hx of dm, cva, ulcers, htn, hyperlipidemia.  Pt has been feeling weak and nauseous since 1/19.  She has had a poor appetite.  Pt is very HOH and is a poor historian.  Most of the history is obtained from her daughter.  Pt has been around some family who have been sick, but they did not see the doctor.  Pt c/o neck pain, but denies any other pain.  Pt's daughter said her urine has been foul smelling.  Pt has not taken any of her meds this am.      Home Medications Prior to Admission medications   Medication Sig Start Date End Date Taking? Authorizing Provider  acetaminophen (TYLENOL) 325 MG tablet Take 2 tablets (650 mg total) by mouth every 6 (six) hours as needed for mild pain (or Fever >/= 101). 06/14/18   Debbe Odea, MD  amLODipine (NORVASC) 5 MG tablet Take 1 tablet (5 mg total) by mouth daily. 07/05/18   Hennie Duos, MD  aspirin 325 MG tablet Take 325 mg by mouth daily.    [provider]  cholecalciferol (VITAMIN D3) 25 MCG (1000 UT) tablet Take 2 tablets (2,000 Units total) by mouth daily. 07/05/18   Hennie Duos, MD  losartan (COZAAR) 50 MG tablet Take 1 tablet (50 mg total) by mouth daily. 07/05/18   Hennie Duos, MD  Multiple Vitamin (MULTI-VITAMIN PO) Take 1 tablet by mouth daily. Reported on 05/28/2015    [provider]  Multiple Vitamins-Minerals (PRESERVISION AREDS PO) Take by mouth 2 (two) times daily. Reported on 05/28/2015    [provider]  pantoprazole (PROTONIX) 40 MG tablet Take 1 tablet (40 mg total) by mouth 2 (two) times daily. 07/05/18   Hennie Duos, MD  pravastatin (PRAVACHOL) 20 MG tablet Take 1 tablet (20 mg total) by mouth daily. 07/05/18   Hennie Duos, MD       Allergies    Patient has no known allergies.    Review of Systems   Review of Systems  Constitutional:  Positive for appetite change and fatigue.  Gastrointestinal:  Positive for nausea.  Musculoskeletal:        Neck pain  Neurological:  Positive for weakness.  All other systems reviewed and are negative.  Physical Exam Updated Vital Signs BP (!) 191/57 (BP Location: Right Arm)    Pulse 64    Temp 99.5 F (37.5 C) (Oral)    Resp 12    Ht 5\' 4"  (1.626 m)    Wt 79.4 kg    SpO2 97%    BMI 30.04 kg/m  Physical Exam Vitals and nursing note reviewed.  Constitutional:      Appearance: She is ill-appearing.  HENT:     Head: Normocephalic and atraumatic.     Right Ear: External ear normal.     Left Ear: External ear normal.     Nose: Nose normal.     Mouth/Throat:     Mouth: Mucous membranes are dry.  Eyes:     Extraocular Movements: Extraocular movements intact.     Conjunctiva/sclera: Conjunctivae normal.     Pupils: Pupils are equal, round, and reactive to  light.  Neck:     Comments: Tenderness to palpation of posterior neck.  No abnormalities    seen. Cardiovascular:     Rate and Rhythm: Normal rate. Rhythm irregularly irregular.     Pulses: Normal pulses.     Heart sounds: Normal heart sounds.  Pulmonary:     Effort: Pulmonary effort is normal.     Breath sounds: Normal breath sounds.  Abdominal:     General: Abdomen is flat. Bowel sounds are normal.     Palpations: Abdomen is soft.  Musculoskeletal:        General: Normal range of motion.     Cervical back: Normal range of motion and neck supple.  Skin:    General: Skin is warm.     Capillary Refill: Capillary refill takes less than 2 seconds.  Neurological:     General: No focal deficit present.     Mental Status: She is alert. Mental status is at baseline.     Comments: Pt is diffusely weak I am unable to assess orientation due to hoh  Psychiatric:        Mood and Affect: Mood normal.        Behavior:  Behavior normal.    ED Results / Procedures / Treatments   Labs (all labs ordered are listed, but only abnormal results are displayed) Labs Reviewed  CBC WITH DIFFERENTIAL/PLATELET - Abnormal; Notable for the following components:      Result Value   WBC 18.1 (*)    Neutro Abs 16.2 (*)    Monocytes Absolute 1.2 (*)    Abs Immature Granulocytes 0.09 (*)    All other components within normal limits  COMPREHENSIVE METABOLIC PANEL - Abnormal; Notable for the following components:   Potassium 3.1 (*)    Chloride 97 (*)    Glucose, Bld 218 (*)    BUN 30 (*)    GFR, Estimated 54 (*)    All other components within normal limits  URINALYSIS, ROUTINE W REFLEX MICROSCOPIC - Abnormal; Notable for the following components:   APPearance CLOUDY (*)    Specific Gravity, Urine >1.030 (*)    Hgb urine dipstick MODERATE (*)    Protein, ur >300 (*)    All other components within normal limits  URINALYSIS, MICROSCOPIC (REFLEX) - Abnormal; Notable for the following components:   Bacteria, UA MANY (*)    Non Squamous Epithelial PRESENT (*)    All other components within normal limits  RESP PANEL BY RT-PCR (FLU A&B, COVID) ARPGX2  URINE CULTURE  MAGNESIUM    EKG EKG Interpretation  Date/Time:  Monday June 24 2021 09:03:19 EST Ventricular Rate:  63 PR Interval:    QRS Duration: 90 QT Interval:  606 QTC Calculation: 621 R Axis:   -11 Text Interpretation: Atrial fibrillation Low voltage, precordial leads Borderline T abnormalities, anterior leads Prolonged QT interval Confirmed by Isla Pence [phone]) on 06/12/2021 9:41:08 AM  Radiology CT Head Wo Contrast  Result Date: 06/11/2021 CLINICAL DATA:  Headache, new or worsening (Age >= 50y) EXAM: CT HEAD WITHOUT CONTRAST TECHNIQUE: Contiguous axial images were obtained from the base of the skull through the vertex without intravenous contrast. RADIATION DOSE REDUCTION: This exam was performed according to the departmental dose-optimization  program which includes automated exposure control, adjustment of the mA and/or kV according to patient size and/or use of iterative reconstruction technique. COMPARISON:  2011 FINDINGS: Brain: Subarachnoid hemorrhage is present involving the basal cisterns with extension into cortical sulci. There is intraventricular  hemorrhage layering in the occipital horns, third ventricle, and fourth ventricle. Disproportionate prominence of the ventricles likely reflects hydrocephalus. Periventricular white matter hypoattenuation could reflect a component of associated interstitial edema. Chronic right parietal infarct with some posterior frontal involvement as well as occipital and posterior temporal involvement. Probable chronic microvascular ischemic changes in the cerebral white matter. Age-indeterminate, probable chronic small vessel infarct of the left corona radiata and basal ganglia. Vascular: There is atherosclerotic calcification at the skull base. Skull: Calvarium is unremarkable. Sinuses/Orbits: No acute finding. Other: None. IMPRESSION: Large volume subarachnoid hemorrhage at the skull base. Intraventricular hemorrhage is present. Hydrocephalus is present. CTA is recommended to evaluate for aneurysm. These results were called by telephone at the time of interpretation on 06/10/2021 at 2:03 pm to provider [facility] , who verbally acknowledged these results. Electronically Signed   By: Macy Mis M.D.   On: 06/28/2021 14:08   CT Cervical Spine Wo Contrast  Result Date: 06/09/2021 CLINICAL DATA:  Neck trauma. Severe neck pain ever since patient started vomiting 06/20/2021. EXAM: CT CERVICAL SPINE WITHOUT CONTRAST TECHNIQUE: Multidetector CT imaging of the cervical spine was performed without intravenous contrast. Multiplanar CT image reconstructions were also generated. RADIATION DOSE REDUCTION: This exam was performed according to the departmental dose-optimization program which includes automated  exposure control, adjustment of the mA and/or kV according to patient size and/or use of iterative reconstruction technique. COMPARISON:  None. FINDINGS: Alignment: 2 mm grade 1 anterolisthesis of C7 on T1. 1-2 mm grade 1 anterolisthesis of C3 on C4. The facet joints are appropriately aligned. Skull base and vertebrae: The atlantodens interval is intact with moderate degenerative change. There is mild scalloping of the anterior superior C4 through C6 endplates with well corticated margins, and this appears chronic. Moderate posterior C5-6, moderate to severe diffuse C6-7, and severe diffuse C7-T1, T1-2 and T2-3 disc space narrowing. Moderate endplate osteophytosis greatest at C7-T1 through T2-3. Soft tissues and spinal canal: There is layering density within the partially visualized bilateral occipital horns (axial series 3, image 1/96), which may be secondary to mild layering acute to subacute blood products within the lateral ventricles. Consider CT of the brain for further evaluation. There also appears to be increased density within the tentorium cerebelli and midline falx which may represent subdural blood. Per discussion with the ordering clinician, the patient did recently have IV contrast for a CT of the abdomen and pelvis, and this may be the etiology for the increased density within the falx and tentorium cerebella, however is not definitively the cause for the density within the dependent lateral ventricles. Disc levels: C2-3: Moderate left facet joint and uncovertebral hypertrophy with mild left neural foraminal stenosis. C3-4: Right facet joint and uncovertebral hypertrophy contribute to mild right neural foraminal stenosis and mild right lateral recess stenosis. C4-5: Severe right and moderate left facet joint hypertrophy. Right-greater-than-left uncovertebral hypertrophy. Mild right neural foraminal stenosis. C5-6: Moderate right-greater-than-left facet joint and uncovertebral hypertrophy. Moderate to  severe bilateral neural foraminal stenosis. Moderate narrowing of lateral recesses. Mild central canal stenosis. C6-7: Moderate bilateral facet joint and ligamentum flavum hypertrophy. Moderate central canal and neural foraminal stenosis. C7-T1: Grade 1 anterolisthesis. Mild broad-based posterior disc osteophyte complex. Moderate bilateral facet joint hypertrophy. Mild central canal and left greater than right neural foraminal stenosis. The lung apices are grossly unremarkable. Upper chest: There is a nodular density within the posterior upper back soft tissues just to the left of midline, centered within the subcutaneous fat just deep to the skin surface  at the superior T1 level measuring up to 2.5 x 1.6 cm (transverse by AP). At the peripheral aspect this measures dense fluid density in the central aspect there is increased density which may represent soft tissue and/or enhancement from recent abdominal CT with IV contrast. This is nonspecific. It may represent a mildly complex sebaceous cyst. Recommend clinical correlation. Other: None. IMPRESSION: 1. There is increased density within the tentorium cerebelli and interhemispheric falx that at first raises the question of blood products within the subdural space, however this may be secondary to IV contrast from CT abdomen and pelvis performed contemporaneously. It is unclear whether layering density within the partially visualized occipital horns relates to this contemporaneous IV contrast administration. Cannot exclude acute to subacute blood products within the occipital horns. Consider head CT for further evaluation. 2. No acute fracture is seen within the cervical spine. 3. Multilevel degenerative disc and joint changes as above. Critical Value/emergent results were called by telephone at the time of interpretation on 06/02/2021 at 1:28 pm to provider Delitha Elms , who verbally acknowledged these results. Electronically Signed   By: Yvonne Kendall M.D.   On:  06/09/2021 13:39   CT ABDOMEN PELVIS W CONTRAST  Result Date: 06/29/2021 CLINICAL DATA:  Nausea and vomiting.  Foul smelling urine. EXAM: CT ABDOMEN AND PELVIS WITH CONTRAST TECHNIQUE: Multidetector CT imaging of the abdomen and pelvis was performed using the standard protocol following bolus administration of intravenous contrast. RADIATION DOSE REDUCTION: This exam was performed according to the departmental dose-optimization program which includes automated exposure control, adjustment of the mA and/or kV according to patient size and/or use of iterative reconstruction technique. CONTRAST:  156mL OMNIPAQUE IOHEXOL 300 MG/ML  SOLN COMPARISON:  06/07/2018 FINDINGS: Lower chest: Enlarged heart with an interval increase in size. Atheromatous calcifications, including the coronary arteries and aorta. Moderate-sized, fluid-filled hiatal hernia with an interval increase in size. The distal esophagus is also mildly dilated and fluid-filled as is the proximal stomach. Hepatobiliary: Multiple partially calcified gallstones in the gallbladder, the largest measuring approximately 1.3 cm in maximum diameter. No gallbladder wall thickening or pericholecystic fluid. Unremarkable liver. Pancreas: Moderate diffuse pancreatic atrophy. Spleen: Normal in size without focal abnormality. Adrenals/Urinary Tract: Normal appearing adrenal glands. Slight increase in size of a simple appearing small upper pole left renal cyst. Unremarkable right kidney, ureters and urinary bladder. Stomach/Bowel: Moderate-sized, fluid-filled hiatal hernia with an interval increase in size. The distal esophagus is also mildly dilated and fluid-filled as is the proximal stomach. Small number of sigmoid colon diverticula without evidence of diverticulitis. Unremarkable small bowel and appendix. Vascular/Lymphatic: Atheromatous arterial calcifications without aneurysm. No enlarged lymph nodes. Reproductive: Status post hysterectomy. Normal appearing  ovaries. No adnexal masses. Other: Mild diffuse subcutaneous edema. Musculoskeletal: Spinal and paraspinal mass measuring 5.0 x 2.5 cm on image number 17/2, expanding the right T11-12 neural foramen with chronic enlargement of the foramen. This previously measured approximately 5.1 x 2.6 cm in corresponding dimensions. Extensive lumbar and lower thoracic spine degenerative changes. IMPRESSION: 1. No acute abnormality in the abdomen or pelvis. 2. Moderate-sized hiatal hernia with evidence of gastroesophageal reflux. 3. Cholelithiasis without evidence of cholecystitis. 4. Sigmoid diverticulosis without evidence of diverticulitis. 5. Interval cardiomegaly and right lower lobe atelectasis. 6. Stable right T11-12 spinal and paraspinal mass measuring approximately 5.0 x 2.5 cm with chronic widening of the right T11-12 neural foramen and erosion of the adjacent transverse process anteriorly. This remains compatible with a neurogenic neoplasm. 7. Mild diffuse subcutaneous edema. 8.  Calcific  coronary artery and aortic atherosclerosis. Electronically Signed   By: Claudie Revering M.D.   On: 06/08/2021 13:20    Procedures Procedures    Medications Ordered in ED Medications  sodium chloride 0.9 % bolus 500 mL (0 mLs Intravenous Stopped 06/10/2021 1014)  acetaminophen (TYLENOL) tablet 650 mg (650 mg Oral Given 06/18/2021 0945)  amLODipine (NORVASC) tablet 5 mg (5 mg Oral Given 06/25/2021 0946)  losartan (COZAAR) tablet 50 mg (50 mg Oral Given 06/03/2021 0946)  ondansetron (ZOFRAN) injection 4 mg (4 mg Intravenous Given 06/18/2021 0944)  sodium chloride 0.9 % bolus 500 mL (0 mLs Intravenous Stopped 06/04/2021 1236)  cefTRIAXone (ROCEPHIN) 1 g in sodium chloride 0.9 % 100 mL IVPB (0 g Intravenous Stopped 06/23/2021 1236)  potassium chloride SA (KLOR-CON M) CR tablet 40 mEq (40 mEq Oral Given 06/16/2021 1155)  iohexol (OMNIPAQUE) 300 MG/ML solution 100 mL (100 mLs Intravenous Contrast Given 06/23/2021 1304)    ED Course/ Medical Decision  Making/ A&P                           Medical Decision Making Amount and/or Complexity of Data Reviewed Labs: ordered. Radiology: ordered. ECG/medicine tests: ordered.  Risk OTC drugs. Prescription drug management. Decision regarding hospitalization.   Labs and scans reviewed by me.  Afib: Pt has been on afib in the past, but is not a candidate for anticoagulation.  Pt's daughter said the doctor was just going to watch it.  Rate is controlled.  CHA2DS2/VAS Stroke Risk Points :  7   Nausea: Initially, I thought it was due to her UTI.  However, due to the elevation in WBC of 18, I ordered a CT abd/pelvis which did not show anything acute.  Due to the neck pain, I scanned her neck.  The radiologist was concerned for a SAH, so she went back for a CT head.  CT head showed a definite SAH.  UTI: Pt treated with 1 g rocephin.  Urine sent for culture.  Daughter does want this treated.  SAH: Pt's daughter said she would not want any surgery or intervention for the [facility], so a CTA was not ordered.  Daughter wants pt to be DNR.  She understands that pt could die.  The pt is 55 and has had a good life.  Daughter is unable to care for pt at home as she is very weak, so I spoke with Dr. Lorin Mercy (triad) who will admit.  CRITICAL CARE Performed by: Isla Pence   Total critical care time: 30 minutes  Critical care time was exclusive of separately billable procedures and treating other patients.  Critical care was necessary to treat or prevent imminent or life-threatening deterioration.  Critical care was time spent personally by me on the following activities: development of treatment plan with patient and/or surrogate as well as nursing, discussions with consultants, evaluation of patient's response to treatment, examination of patient, obtaining history from patient or surrogate, ordering and performing treatments and interventions, ordering and review of laboratory studies, ordering and  review of radiographic studies, pulse oximetry and re-evaluation of patient's condition.           Final Clinical Impression(s) / ED Diagnoses Final diagnoses:  SAH (subarachnoid hemorrhage) ([place])  Acute cystitis without hematuria  Nausea and vomiting, unspecified vomiting type  Dehydration  Hypokalemia    Rx / DC Orders ED Discharge Orders     None         Deyja Sochacki,  Almyra Free, MD 06/04/2021 1445

## 2021-06-24 NOTE — Progress Notes (Signed)
Plan of Care Note for accepted transfer   Patient: Briana Martinez MRN: [id number]   DOA: 07-04-21  Facility requesting transfer: [facility] Requesting Provider: Particia Nearing Reason for transfer:   88yo with nausea, poor PO intake since 1/19.  She has been dwindling and so was brought in.  CT head with SAH.  Daughter does not request treatment.  She is DNR.  Would like comfort now with no treatment for [facility], but ok with limited UTI treatment for now.   Plan of care: The patient is accepted for admission to palliative unit, at [facility].   Author: Jonah Blue, MD 07-04-2021  Check [email or web address] for on-call coverage.  Nursing staff, Please call [facility] Admits & Consults System-Wide number on Amion as soon as patient's arrival, so appropriate admitting provider can evaluate the pt.

## 2021-06-24 NOTE — ED Notes (Signed)
Able to tolerate PO fluids well, strong swallow noted, more alert and talkative since arrival to ED

## 2021-06-25 DIAGNOSIS — I609 Nontraumatic subarachnoid hemorrhage, unspecified: Secondary | ICD-10-CM | POA: Diagnosis not present

## 2021-06-25 DIAGNOSIS — Z515 Encounter for palliative care: Secondary | ICD-10-CM | POA: Diagnosis not present

## 2021-06-25 LAB — GLUCOSE, CAPILLARY: Glucose-Capillary: 225 mg/dL — ABNORMAL HIGH (ref 70–99)

## 2021-06-25 MED ORDER — MORPHINE SULFATE (PF) 2 MG/ML IV SOLN
2.0000 mg | INTRAVENOUS | Status: AC
  Administered 2021-06-25: 14:00:00 2 mg via INTRAVENOUS
  Filled 2021-06-25: qty 1

## 2021-06-25 MED ORDER — MORPHINE SULFATE (PF) 2 MG/ML IV SOLN
2.0000 mg | INTRAVENOUS | Status: DC | PRN
  Administered 2021-06-25 – 2021-06-26 (×6): 2 mg via INTRAVENOUS
  Filled 2021-06-25 (×6): qty 1

## 2021-06-25 NOTE — Consult Note (Signed)
Consultation Note Date: 06/25/2021   Patient Name: Briana Martinez  DOB: [birth year]  MRN: [id number]  Age / Sex: 88 y.o., female  PCP: [name], MD Referring Physician: Darliss Cheney, MD  Reason for Consultation:  end of life care  HPI/Patient Profile: 88 y.o. female  with past medical history of CVA, HTN, HLD, HOH,  admitted on 06/25/2021 with encephalopathy/hypersomnolence, workup included CT scan which was reviewed and shows large volume subarachnoid hemorrhage at the skull base with intrventricular hemorrhage. Per discussion with attending team patient was transitioned to comfort. Palliative medicine consulted for EOL care.    Primary Decision Maker NEXT OF KIN - patient's daughter  Discussion: Patient in bed, unarouseable. Tachynpneic. Daughter, Collie Siad and and daughter in law, Arrie Aran are at bedside.  They confirm that [place] are comfort and symptom management, no escalation of care.  We discussed role of morphine in comfort. Patient with nasal cannula in place- we discussed role of supplemental oxygen at end of life. Patient does not wear oxygen at baseline. No need to continue nasal cannula as it is not contributing to comfort that can be managed with IV morphine.  Emotional support provided to Collie Siad- she recently experienced the death of her spouse and her mother's state is bringing back her grief.  They declined chaplain support.     SUMMARY OF RECOMMENDATIONS -Patient dying from subarachnoid hemmorhage -[facility] are comfort and supporting through dying process -Discussed symptom management with RN- patient appeared uncomfortable with increased RR and furrowed brow- encouraged use of morphine to comfort, order for 2mg  IV now entered, then d/c oxygen nasal cannula -Agree that patient is not stable for transfer out of hospital, anticipate hospital death    Code Status/Advance Care Planning: DNR   Prognosis:    Hours - Days  Discharge Planning: Anticipated Hospital Death   Review of Systems  Unable to perform ROS  Physical Exam Vitals and nursing note reviewed.  Cardiovascular:     Comments: Peripheral pulses weak, extremities cool Pulmonary:     Comments: tachypneic Skin:    Comments: ashen  Neurological:     Comments: unresponsive    Vital Signs: BP (!) 179/52 (BP Location: Right Arm)    Pulse 67    Temp 98.5 F (36.9 C) (Oral)    Resp (!) 27    Ht 5\' 4"  (1.626 m)    Wt 79.4 kg    SpO2 92%    BMI 30.04 kg/m  Pain Scale: Faces   Pain Score: 0-No pain   SpO2: SpO2: 92 % O2 Device:SpO2: 92 % O2 Flow Rate: .O2 Flow Rate (L/min): 2 L/min  IO: Intake/output summary: No intake or output data in the 24 hours ending 06/25/21 1440  LBM:   Baseline Weight: Weight: 79.4 kg Most recent weight: Weight: 79.4 kg     Palliative Assessment/Data: PPS: 10%       Signed by: Mariana Kaufman, AGNP-C Palliative Medicine    Please contact Palliative Medicine Team phone at [phone] for questions and concerns.  For individual  provider: See Shea Evans

## 2021-06-25 NOTE — Progress Notes (Signed)
PROGRESS NOTE    Briana Martinez  [id number] DOB: [birth year] DOA: 06/28/2021 PCP: [name], MD   Brief Narrative:  HPI: Briana Martinez is a 88 y.o. female with medical history significant for history of CVA, Mobitz type II heart block, hypertension, hyperlipidemia, hard of hearing who presented to [place] ED for evaluation of nausea and weakness.  Patient unable to provide history due to encephalopathy/hypersomnolence and is otherwise obtained by chart review and patient's granddaughter in law at bedside.  Family states that patient was living alone up until 3 weeks ago when family brought her to live with them locally.  2 days ago patient began complaining of neck pain.  She has been having nausea and vomiting.  She has been feeling generally weak.  Today she committed family that her symptoms were worsening and that she needed further medical attention.  Family subsequently took patient to [place] ED for further evaluation.  Patient seen on the floor after transfer to [facility].  She is asleep and difficult to arouse with some agonal breathing.  She moves her right upper extremity spontaneously and keeps her left hand gripped on the bed remote but otherwise not communicative or following commands.  Patient's granddaughter-in-law is at bedside and confirms that patient is DNR/DNI and that she or family would not want any aggressive intervention.  Goal is comfort based as they acknowledge that she is approaching end-of-life.   [facility] ED Course:  Initial vitals showed BP 182/62, pulse 68, RR 20, temp 99.5 F, SPO2 94% on room air.  While in the ED, SPO2 dropped to 86-89% on room air and she was placed on 2 L supplemental O2 via [place] with improvement.   Labs show WBC 18.1, hemoglobin 14.0, platelets 280,000, sodium 137, potassium 3.1, bicarb 27, BUN 30, creatinine 0.96, serum glucose 218, LFTs within normal limits, magnesium 1.9.    Urinalysis shows >300 protein, negative nitrates, negative leukocytes, 11-20 RBCs and WBC/hpf, many bacteria on microscopy.  Urine culture was obtained and pending.   SARS-CoV-2 and influenza PCR negative.   CT cervical spine showed changes concerning for blood products within the subdural space and occipital horns.  No cervical spine fracture seen.   CT head without contrast showed large volume subarachnoid hemorrhage at the skull base with intraventricular hemorrhage and hydrocephalus.   CT abdomen/pelvis with contrast negative for acute abnormality.  Moderate size hiatal hernia, cholelithiasis without cholecystitis, sigmoid diverticulosis without diverticulitis, cardiomegaly and right lower lobe atelectasis noted.  Stable right T11-12 spinal and paraspinal mass seen, felt compatible with a neurogenic neoplasm.   Patient was given IV ceftriaxone, normal saline 500 mL bolus x2, 40 mill equivalents of oral potassium.  Per EDP discussion with family, patient is DNR and she would not want any surgery or intervention for the subarachnoid hemorrhage.  The hospitalist service was consulted to admit for further management.  Assessment & Plan:   Principal Problem:   End of life care Active Problems:   Benign essential HTN   H/O: CVA (cerebrovascular accident)   Subarachnoid hemorrhage (HCC)  Large volume subarachnoid hemorrhage at the base of the skull with intraventricular hemorrhage and hydrocephalus/acute metabolic encephalopathy: Patient was admitted for comfort care after discussion with the family.  Family which includes daughter and daughter-in-law is at the bedside during my visit.  Patient although is slightly tachypneic with agonal breathing but appears to be comfortable.  Goals of care confirmed with the patient.  I anticipate death in the hospital in next 1 to 2 days.  I do not think she is stable to be transferred to [facility].  I have consulted palliative care to further  assist.   DVT prophylaxis:    Code Status: DNR  Family Communication: Daughter and daughter-in-law at bedside. Status is: Inpatient  Remains inpatient appropriate because: Too unstable to be transferred to any other facility.  Anticipate death in the hospital.       Estimated body mass index is 30.04 kg/m as calculated from the following:   Height as of this encounter: 5\' 4"  (1.626 m).   Weight as of this encounter: 79.4 kg.  Pressure Injury 06/02/2021 Buttocks Right Unstageable - Full thickness tissue loss in which the base of the injury is covered by slough (yellow, tan, gray, green or brown) and/or eschar (tan, brown or black) in the wound bed. (Active)  06/23/2021 1845  Location: Buttocks  Location Orientation: Right  Staging: Unstageable - Full thickness tissue loss in which the base of the injury is covered by slough (yellow, tan, gray, green or brown) and/or eschar (tan, brown or black) in the wound bed.  Wound Description (Comments):   Present on Admission: Yes   Nutritional Assessment: Body mass index is 30.04 kg/m.Marland Kitchen Seen by dietician.  I agree with the assessment and plan as outlined below: Nutrition Status:        . Skin Assessment: I have examined the patient's skin and I agree with the wound assessment as performed by the wound care RN as outlined below: Pressure Injury 06/23/2021 Buttocks Right Unstageable - Full thickness tissue loss in which the base of the injury is covered by slough (yellow, tan, gray, green or brown) and/or eschar (tan, brown or black) in the wound bed. (Active)  06/16/2021 1845  Location: Buttocks  Location Orientation: Right  Staging: Unstageable - Full thickness tissue loss in which the base of the injury is covered by slough (yellow, tan, gray, green or brown) and/or eschar (tan, brown or black) in the wound bed.  Wound Description (Comments):   Present on Admission: Yes    Consultants:  Palliative care  Procedures:   None  Antimicrobials:  Anti-infectives (From admission, onward)    Start     Dose/Rate Route Frequency Ordered Stop   06/29/2021 1200  cefTRIAXone (ROCEPHIN) 1 g in sodium chloride 0.9 % 100 mL IVPB        1 g 200 mL/hr over 30 Minutes Intravenous  Once 06/13/2021 1148 06/20/2021 1236         Subjective: Seen and examined.  Family at bedside.  Patient not arousable, slightly tachypneic with agonal breathing intermittently but appears comfortable.  Objective: Vitals:   06/23/2021 2015 06/25/21 0537 06/25/21 0538 06/25/21 0716  BP: (!) 162/81 (!) 180/90 (!) 180/90 (!) 179/52  Pulse: 82 71 72 67  Resp: (!) 27     Temp: 98.4 F (36.9 C) 98.5 F (36.9 C) 98.5 F (36.9 C)   TempSrc: Oral Oral Oral   SpO2: 92% 93% 92%   Weight:      Height:       No intake or output data in the 24 hours ending 06/25/21 1244 Filed Weights   06/09/2021 0847  Weight: 79.4 kg    Examination:  General exam: Appears calm and comfortable  Respiratory system: Clear to auscultation but tachypnea with agonal breathing. Cardiovascular system: S1 & S2 heard, RRR. No JVD, murmurs, rubs, gallops or clicks. No pedal edema.  Gastrointestinal system: Abdomen is nondistended, soft and nontender. No organomegaly or masses felt. Normal bowel sounds heard.  Data Reviewed: I have personally reviewed following labs and imaging studies  CBC: Recent Labs  Lab 06/08/2021 0858  WBC 18.1*  NEUTROABS 16.2*  HGB 14.0  HCT 41.6  MCV 94.8  PLT 123456   Basic Metabolic Panel: Recent Labs  Lab 06/23/2021 0858  NA 137  K 3.1*  CL 97*  CO2 27  GLUCOSE 218*  BUN 30*  CREATININE 0.96  CALCIUM 9.7  MG 1.9   GFR: Estimated Creatinine Clearance: 34.2 mL/min (by C-G formula based on SCr of 0.96 mg/dL). Liver Function Tests: Recent Labs  Lab 06/13/2021 0858  AST 39  ALT 41  ALKPHOS 69  BILITOT 1.1  PROT 8.0  ALBUMIN 3.7   No results for input(s): LIPASE, AMYLASE in the last 168 hours. No results for input(s):  AMMONIA in the last 168 hours. Coagulation Profile: No results for input(s): INR, PROTIME in the last 168 hours. Cardiac Enzymes: No results for input(s): CKTOTAL, CKMB, CKMBINDEX, TROPONINI in the last 168 hours. BNP (last 3 results) No results for input(s): PROBNP in the last 8760 hours. HbA1C: No results for input(s): HGBA1C in the last 72 hours. CBG: Recent Labs  Lab 06/13/2021 1752  GLUCAP 225*   Lipid Profile: No results for input(s): CHOL, HDL, LDLCALC, TRIG, CHOLHDL, LDLDIRECT in the last 72 hours. Thyroid Function Tests: No results for input(s): TSH, T4TOTAL, FREET4, T3FREE, THYROIDAB in the last 72 hours. Anemia Panel: No results for input(s): VITAMINB12, FOLATE, FERRITIN, TIBC, IRON, RETICCTPCT in the last 72 hours. Sepsis Labs: No results for input(s): PROCALCITON, LATICACIDVEN in the last 168 hours.  Recent Results (from the past 240 hour(s))  Resp Panel by RT-PCR (Flu A&B, Covid) Nasopharyngeal Swab     Status: None   Collection Time: 06/19/2021  9:05 AM   Specimen: Nasopharyngeal Swab; Nasopharyngeal(NP) swabs in vial transport medium  Result Value Ref Range Status   SARS Coronavirus 2 by RT PCR NEGATIVE NEGATIVE Final    Comment: (NOTE) SARS-CoV-2 target nucleic acids are NOT DETECTED.  The SARS-CoV-2 RNA is generally detectable in upper respiratory specimens during the acute phase of infection. The lowest concentration of SARS-CoV-2 viral copies this assay can detect is 138 copies/mL. A negative result does not preclude SARS-Cov-2 infection and should not be used as the sole basis for treatment or other patient management decisions. A negative result may occur with  improper specimen collection/handling, submission of specimen other than nasopharyngeal swab, presence of viral mutation(s) within the areas targeted by this assay, and inadequate number of viral copies(<138 copies/mL). A negative result must be combined with clinical observations, patient history,  and epidemiological information. The expected result is Negative.  Fact Sheet for Patients:  EntrepreneurPulse.com.au  Fact Sheet for Healthcare Providers:  IncredibleEmployment.be  This test is no t yet approved or cleared by the [place] FDA and  has been authorized for detection and/or diagnosis of SARS-CoV-2 by FDA under an Emergency Use Authorization (EUA). This EUA will remain  in effect (meaning this test can be used) for the duration of the COVID-19 declaration under Section 564(b)(1) of the Act, 21 U.S.C.section 360bbb-3(b)(1), unless the authorization is terminated  or revoked sooner.       Influenza A by PCR NEGATIVE NEGATIVE Final   Influenza B by PCR NEGATIVE NEGATIVE Final    Comment: (NOTE) The Xpert Xpress SARS-CoV-2/FLU/RSV plus assay is intended as an aid in the diagnosis  of influenza from Nasopharyngeal swab specimens and should not be used as a sole basis for treatment. Nasal washings and aspirates are unacceptable for Xpert Xpress SARS-CoV-2/FLU/RSV testing.  Fact Sheet for Patients: EntrepreneurPulse.com.au  Fact Sheet for Healthcare Providers: IncredibleEmployment.be  This test is not yet approved or cleared by the [place] FDA and has been authorized for detection and/or diagnosis of SARS-CoV-2 by FDA under an Emergency Use Authorization (EUA). This EUA will remain in effect (meaning this test can be used) for the duration of the COVID-19 declaration under Section 564(b)(1) of the Act, 21 U.S.C. section 360bbb-3(b)(1), unless the authorization is terminated or revoked.  Performed at [facility], [address] [place], [place] [address]      Radiology Studies: CT Head Wo Contrast  Result Date: 06/05/2021 CLINICAL DATA:  Headache, new or worsening (Age >= 50y) EXAM: CT HEAD WITHOUT CONTRAST TECHNIQUE: Contiguous axial images were obtained from the base  of the skull through the vertex without intravenous contrast. RADIATION DOSE REDUCTION: This exam was performed according to the departmental dose-optimization program which includes automated exposure control, adjustment of the mA and/or kV according to patient size and/or use of iterative reconstruction technique. COMPARISON:  2011 FINDINGS: Brain: Subarachnoid hemorrhage is present involving the basal cisterns with extension into cortical sulci. There is intraventricular hemorrhage layering in the occipital horns, third ventricle, and fourth ventricle. Disproportionate prominence of the ventricles likely reflects hydrocephalus. Periventricular white matter hypoattenuation could reflect a component of associated interstitial edema. Chronic right parietal infarct with some posterior frontal involvement as well as occipital and posterior temporal involvement. Probable chronic microvascular ischemic changes in the cerebral white matter. Age-indeterminate, probable chronic small vessel infarct of the left corona radiata and basal ganglia. Vascular: There is atherosclerotic calcification at the skull base. Skull: Calvarium is unremarkable. Sinuses/Orbits: No acute finding. Other: None. IMPRESSION: Large volume subarachnoid hemorrhage at the skull base. Intraventricular hemorrhage is present. Hydrocephalus is present. CTA is recommended to evaluate for aneurysm. These results were called by telephone at the time of interpretation on 06/21/2021 at 2:03 pm to provider [facility] , who verbally acknowledged these results. Electronically Signed   By: Macy Mis M.D.   On: 06/29/2021 14:08   CT Cervical Spine Wo Contrast  Result Date: 07/01/2021 CLINICAL DATA:  Neck trauma. Severe neck pain ever since patient started vomiting 06/20/2021. EXAM: CT CERVICAL SPINE WITHOUT CONTRAST TECHNIQUE: Multidetector CT imaging of the cervical spine was performed without intravenous contrast. Multiplanar CT image reconstructions  were also generated. RADIATION DOSE REDUCTION: This exam was performed according to the departmental dose-optimization program which includes automated exposure control, adjustment of the mA and/or kV according to patient size and/or use of iterative reconstruction technique. COMPARISON:  None. FINDINGS: Alignment: 2 mm grade 1 anterolisthesis of C7 on T1. 1-2 mm grade 1 anterolisthesis of C3 on C4. The facet joints are appropriately aligned. Skull base and vertebrae: The atlantodens interval is intact with moderate degenerative change. There is mild scalloping of the anterior superior C4 through C6 endplates with well corticated margins, and this appears chronic. Moderate posterior C5-6, moderate to severe diffuse C6-7, and severe diffuse C7-T1, T1-2 and T2-3 disc space narrowing. Moderate endplate osteophytosis greatest at C7-T1 through T2-3. Soft tissues and spinal canal: There is layering density within the partially visualized bilateral occipital horns (axial series 3, image 1/96), which may be secondary to mild layering acute to subacute blood products within the lateral ventricles. Consider CT of the brain for further  evaluation. There also appears to be increased density within the tentorium cerebelli and midline falx which may represent subdural blood. Per discussion with the ordering clinician, the patient did recently have IV contrast for a CT of the abdomen and pelvis, and this may be the etiology for the increased density within the falx and tentorium cerebella, however is not definitively the cause for the density within the dependent lateral ventricles. Disc levels: C2-3: Moderate left facet joint and uncovertebral hypertrophy with mild left neural foraminal stenosis. C3-4: Right facet joint and uncovertebral hypertrophy contribute to mild right neural foraminal stenosis and mild right lateral recess stenosis. C4-5: Severe right and moderate left facet joint hypertrophy. Right-greater-than-left  uncovertebral hypertrophy. Mild right neural foraminal stenosis. C5-6: Moderate right-greater-than-left facet joint and uncovertebral hypertrophy. Moderate to severe bilateral neural foraminal stenosis. Moderate narrowing of lateral recesses. Mild central canal stenosis. C6-7: Moderate bilateral facet joint and ligamentum flavum hypertrophy. Moderate central canal and neural foraminal stenosis. C7-T1: Grade 1 anterolisthesis. Mild broad-based posterior disc osteophyte complex. Moderate bilateral facet joint hypertrophy. Mild central canal and left greater than right neural foraminal stenosis. The lung apices are grossly unremarkable. Upper chest: There is a nodular density within the posterior upper back soft tissues just to the left of midline, centered within the subcutaneous fat just deep to the skin surface at the superior T1 level measuring up to 2.5 x 1.6 cm (transverse by AP). At the peripheral aspect this measures dense fluid density in the central aspect there is increased density which may represent soft tissue and/or enhancement from recent abdominal CT with IV contrast. This is nonspecific. It may represent a mildly complex sebaceous cyst. Recommend clinical correlation. Other: None. IMPRESSION: 1. There is increased density within the tentorium cerebelli and interhemispheric falx that at first raises the question of blood products within the subdural space, however this may be secondary to IV contrast from CT abdomen and pelvis performed contemporaneously. It is unclear whether layering density within the partially visualized occipital horns relates to this contemporaneous IV contrast administration. Cannot exclude acute to subacute blood products within the occipital horns. Consider head CT for further evaluation. 2. No acute fracture is seen within the cervical spine. 3. Multilevel degenerative disc and joint changes as above. Critical Value/emergent results were called by telephone at the time of  interpretation on 06/04/2021 at 1:28 pm to provider JULIE HAVILAND , who verbally acknowledged these results. Electronically Signed   By: Yvonne Kendall M.D.   On: 06/29/2021 13:39   CT ABDOMEN PELVIS W CONTRAST  Result Date: 06/30/2021 CLINICAL DATA:  Nausea and vomiting.  Foul smelling urine. EXAM: CT ABDOMEN AND PELVIS WITH CONTRAST TECHNIQUE: Multidetector CT imaging of the abdomen and pelvis was performed using the standard protocol following bolus administration of intravenous contrast. RADIATION DOSE REDUCTION: This exam was performed according to the departmental dose-optimization program which includes automated exposure control, adjustment of the mA and/or kV according to patient size and/or use of iterative reconstruction technique. CONTRAST:  167mL OMNIPAQUE IOHEXOL 300 MG/ML  SOLN COMPARISON:  06/07/2018 FINDINGS: Lower chest: Enlarged heart with an interval increase in size. Atheromatous calcifications, including the coronary arteries and aorta. Moderate-sized, fluid-filled hiatal hernia with an interval increase in size. The distal esophagus is also mildly dilated and fluid-filled as is the proximal stomach. Hepatobiliary: Multiple partially calcified gallstones in the gallbladder, the largest measuring approximately 1.3 cm in maximum diameter. No gallbladder wall thickening or pericholecystic fluid. Unremarkable liver. Pancreas: Moderate diffuse pancreatic atrophy. Spleen: Normal in  size without focal abnormality. Adrenals/Urinary Tract: Normal appearing adrenal glands. Slight increase in size of a simple appearing small upper pole left renal cyst. Unremarkable right kidney, ureters and urinary bladder. Stomach/Bowel: Moderate-sized, fluid-filled hiatal hernia with an interval increase in size. The distal esophagus is also mildly dilated and fluid-filled as is the proximal stomach. Small number of sigmoid colon diverticula without evidence of diverticulitis. Unremarkable small bowel and appendix.  Vascular/Lymphatic: Atheromatous arterial calcifications without aneurysm. No enlarged lymph nodes. Reproductive: Status post hysterectomy. Normal appearing ovaries. No adnexal masses. Other: Mild diffuse subcutaneous edema. Musculoskeletal: Spinal and paraspinal mass measuring 5.0 x 2.5 cm on image number 17/2, expanding the right T11-12 neural foramen with chronic enlargement of the foramen. This previously measured approximately 5.1 x 2.6 cm in corresponding dimensions. Extensive lumbar and lower thoracic spine degenerative changes. IMPRESSION: 1. No acute abnormality in the abdomen or pelvis. 2. Moderate-sized hiatal hernia with evidence of gastroesophageal reflux. 3. Cholelithiasis without evidence of cholecystitis. 4. Sigmoid diverticulosis without evidence of diverticulitis. 5. Interval cardiomegaly and right lower lobe atelectasis. 6. Stable right T11-12 spinal and paraspinal mass measuring approximately 5.0 x 2.5 cm with chronic widening of the right T11-12 neural foramen and erosion of the adjacent transverse process anteriorly. This remains compatible with a neurogenic neoplasm. 7. Mild diffuse subcutaneous edema. 8.  Calcific coronary artery and aortic atherosclerosis. Electronically Signed   By: Claudie Revering M.D.   On: 06/02/2021 13:20    Scheduled Meds: Continuous Infusions:   LOS: 1 day   Time spent: 27 minutes  Darliss Cheney, MD [facility]  06/25/2021, 12:44 PM  Please page via [facility] and do not message via secure chat for anything urgent. Secure chat can be used for anything non urgent.  How to contact the [facility] Attending or Consulting provider [place] or covering provider during after hours [place], for this patient?  Check the care team in [facility] and look for a) attending/consulting [facility] provider listed and b) the [facility] team listed. Page or secure chat [phone]. Log into [email or web address] and use [place]'s universal password to access. If you do not have the password, please contact the  hospital operator. Locate the [facility] provider you are looking for under [facility] and page to a number that you can be directly reached. If you still have difficulty reaching the provider, please page the [facility] (Director on Call) for the Hospitalists listed on amion for assistance.

## 2021-06-26 LAB — URINE CULTURE: Culture: >=100000 — AB

## 2021-06-26 MED ORDER — PROPOFOL 1000 MG/100ML IV EMUL
INTRAVENOUS | Status: AC
  Filled 2021-06-26: qty 200

## 2021-07-03 NOTE — Progress Notes (Signed)
Pt DNR , no pulse, bilateral lung sounds auscultated- no sounds heard. Time of death 64, pronounced by Tracie Harrier, RN and [employer], RN. Daughter called and made aware, awaiting arrival. Physician on call notified, J. Olena Heckle, NP. [facility] notified.

## 2021-07-03 NOTE — Progress Notes (Signed)
° ° °  OVERNIGHT PROGRESS REPORT  Notified by RN that patient has expired at 0423 hrs  Patient was DNR.comfort Care followed by Palliative.  2 RN verified.  Family was not immediately available at bedside and are being notified by RN.     Gershon Cull MSNA ACNPC-AG Acute Care Nurse Practitioner [place]

## 2021-07-03 NOTE — Death Summary Note (Signed)
Death Summary  Briana Martinez [id number] DOB: May 18, 1922 DOA: 2021-07-19  PCP: [name], MD PCP/Office notified:   Admit date: 07/19/21 Date of Death: 2021-07-21  Final Diagnoses:  Principal Problem:   End of life care Active Problems:   Benign essential HTN   H/O: CVA (cerebrovascular accident)   Subarachnoid hemorrhage ([place])    Large volume subarachnoid hemorrhage Intraventricular hemorrhage Acute metabolic encephalopathy  History of present illness:  Briana Martinez is a 88 y.o. female with medical history significant for history of CVA, Mobitz type II heart block, hypertension, hyperlipidemia, hard of hearing who presented to [facility] ED for evaluation of nausea and weakness.  Patient unable to provide history due to encephalopathy/hypersomnolence and is otherwise obtained by chart review and patient's granddaughter in law at bedside.  Family states that patient was living alone up until 3 weeks ago when family brought her to live with them locally.  2 days ago patient began complaining of neck pain.  She has been having nausea and vomiting.  She has been feeling generally weak.  Today she committed family that her symptoms were worsening and that she needed further medical attention.  Family subsequently took patient to [place] ED for further evaluation.  Patient seen on the floor after transfer to [facility].  She is asleep and difficult to arouse with some agonal breathing.  She moves her right upper extremity spontaneously and keeps her left hand gripped on the bed remote but otherwise not communicative or following commands.  Patient's granddaughter-in-law is at bedside and confirms that patient is DNR/DNI and that she or family would not want any aggressive intervention.  Goal is comfort based as they acknowledge that she is approaching end-of-life.   [facility] ED Course:  Initial vitals showed BP 182/62, pulse 68, RR  20, temp 99.5 F, SPO2 94% on room air.  While in the ED, SPO2 dropped to 86-89% on room air and she was placed on 2 L supplemental O2 via [place] with improvement.   Labs show WBC 18.1, hemoglobin 14.0, platelets 280,000, sodium 137, potassium 3.1, bicarb 27, BUN 30, creatinine 0.96, serum glucose 218, LFTs within normal limits, magnesium 1.9.   Urinalysis shows >300 protein, negative nitrates, negative leukocytes, 11-20 RBCs and WBC/hpf, many bacteria on microscopy.  Urine culture was obtained and pending.   SARS-CoV-2 and influenza PCR negative.   CT cervical spine showed changes concerning for blood products within the subdural space and occipital horns.  No cervical spine fracture seen.   CT head without contrast showed large volume subarachnoid hemorrhage at the skull base with intraventricular hemorrhage and hydrocephalus.   CT abdomen/pelvis with contrast negative for acute abnormality.  Moderate size hiatal hernia, cholelithiasis without cholecystitis, sigmoid diverticulosis without diverticulitis, cardiomegaly and right lower lobe atelectasis noted.  Stable right T11-12 spinal and paraspinal mass seen, felt compatible with a neurogenic neoplasm.   Patient was given IV ceftriaxone, normal saline 500 mL bolus x2, 40 mill equivalents of oral potassium.  Per EDP discussion with family, patient is DNR and she would not want any surgery or intervention for the subarachnoid hemorrhage.  The hospitalist service was consulted to admit for further management.   Hospital Course:  Large volume subarachnoid hemorrhage at the base of the skull with intraventricular hemorrhage and hydrocephalus/acute metabolic encephalopathy: Patient was admitted for comfort care after discussion with the family.  Family which includes daughter and daughter-in-law is at the bedside during my  visit.  Patient although is slightly tachypneic with agonal breathing but appears to be comfortable.  Goals of care confirmed with the  family.  Palliative care was consulted however patient was not seen.  Patient eventually passed away at 4:23 AM on 14-Jul-2021.   Time: 13 MIN  Signed:  Darliss Cheney  [facility] 07/14/21, 7:54 AM

## 2021-07-03 DEATH — deceased

## 2023-04-24 IMAGING — CT CT HEAD W/O CM
3 series · 15 of 47 positions shown, 18 images · non-contrast
Comparison: 7311

CLINICAL DATA: Headache, new or worsening (Age >= 50y)



[Series 2: head wo · axial · 0.42mm/px · z∈[-150,-20]mm · 9 of 32 slices shown, 12 images]
[im 3/32  brain]
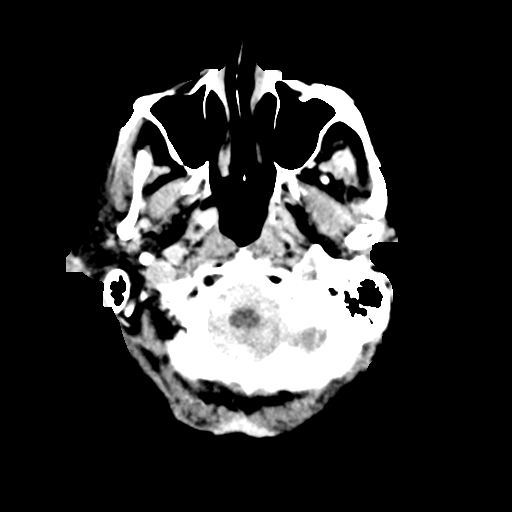
[im 3/32  bone]
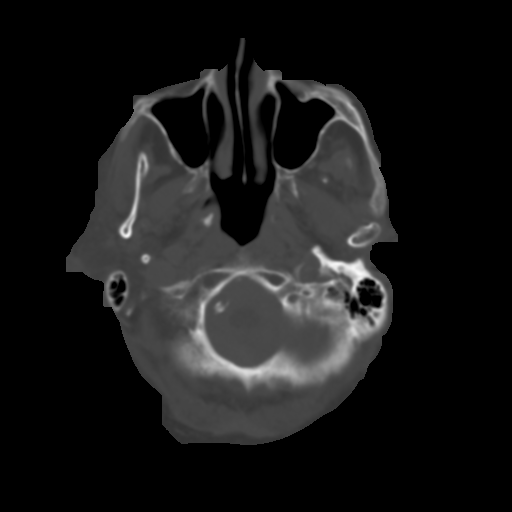
[im 6/32  brain]
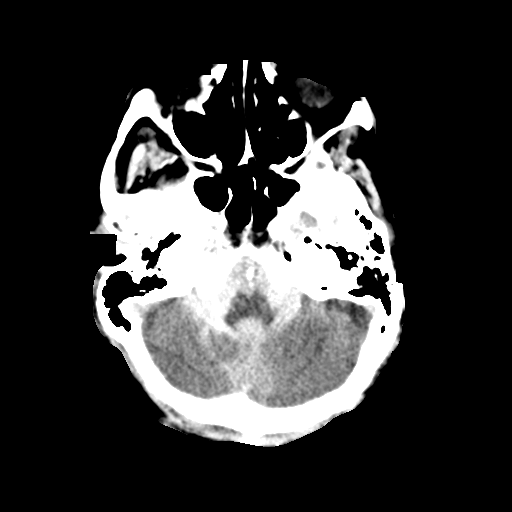
[im 9/32  brain]
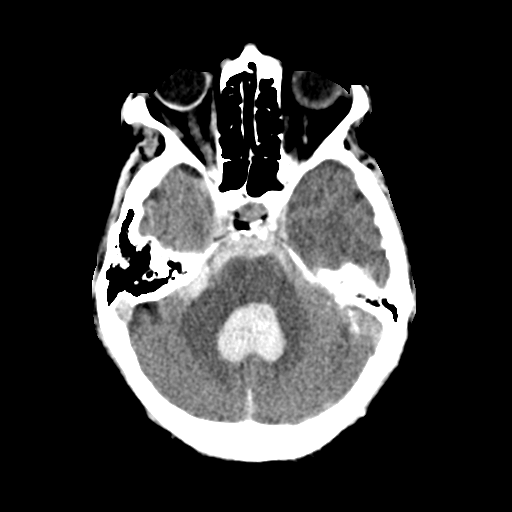
[im 12/32  brain]
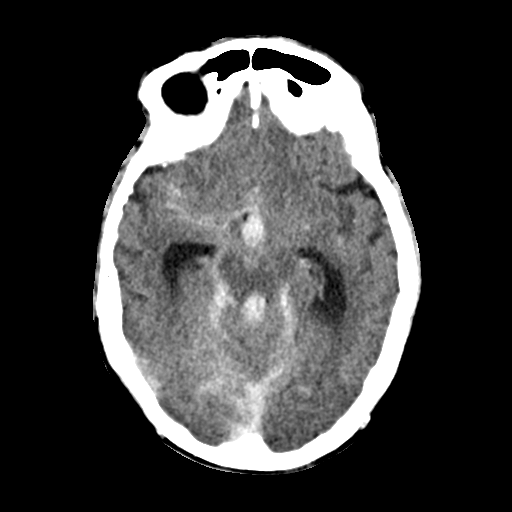
[im 17/32  brain]
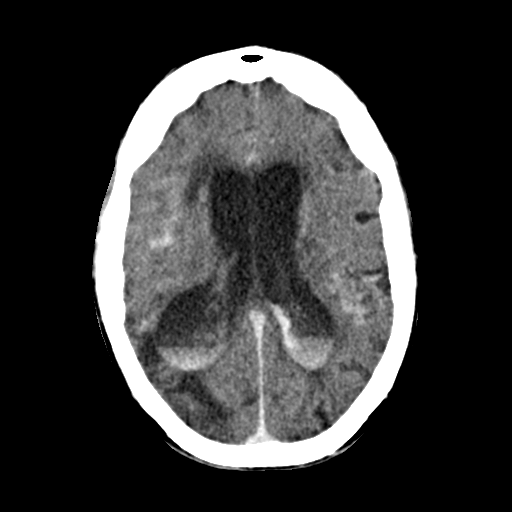
[im 17/32  bone]
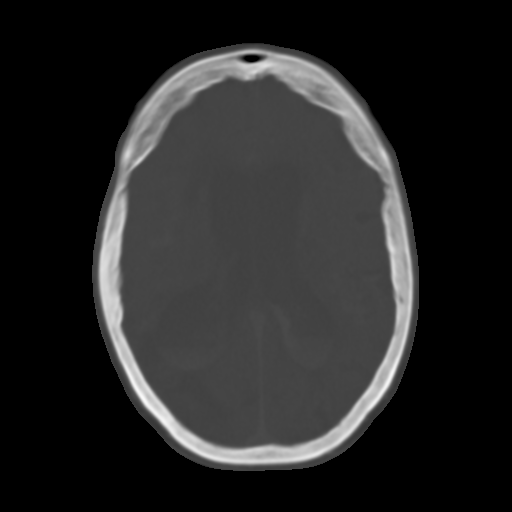
[im 20/32  brain]
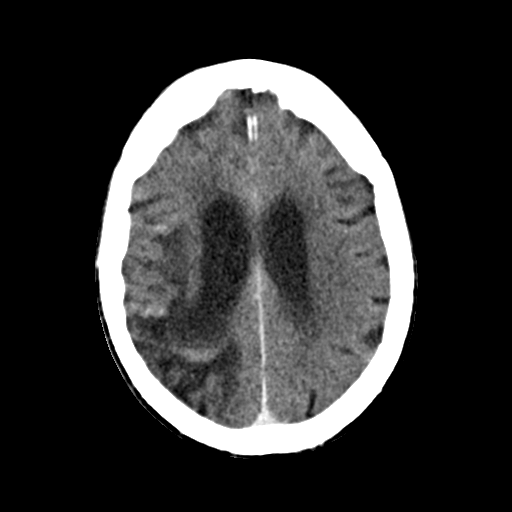
[im 23/32  brain]
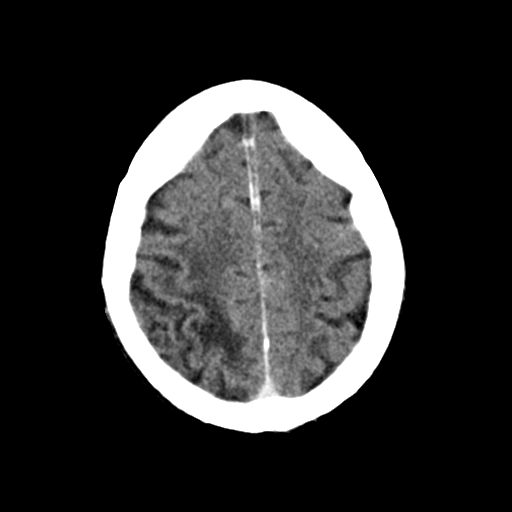
[im 26/32  brain]
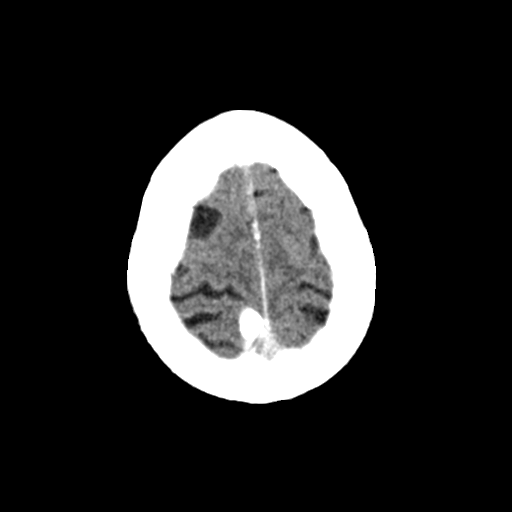
[im 29/32  brain]
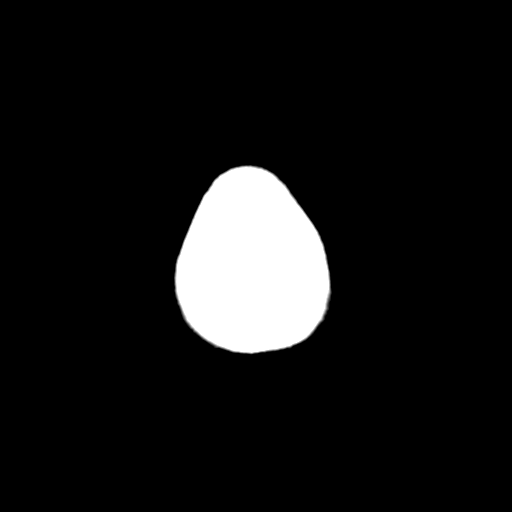
[im 29/32  bone]
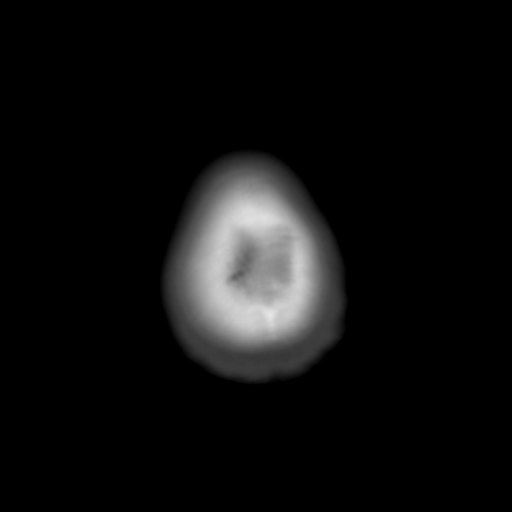

[Series 4: coronal soft · coronal · 0.32mm/px · 3 of 65 slices shown]
[im 22/65  brain]
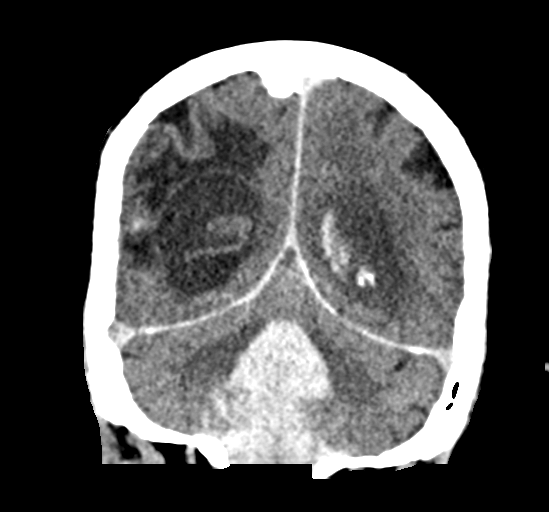
[im 29/65  brain]
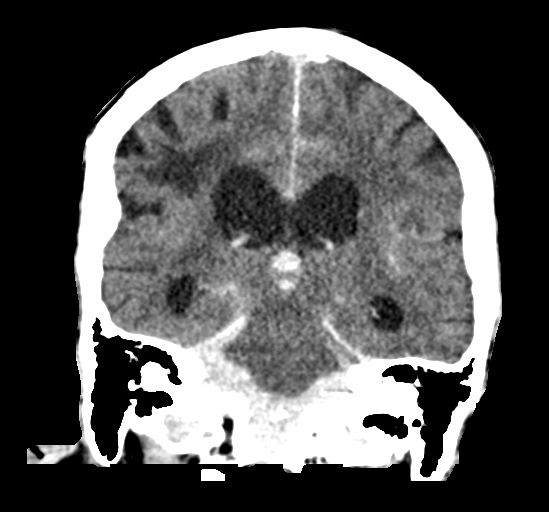
[im 36/65  brain]
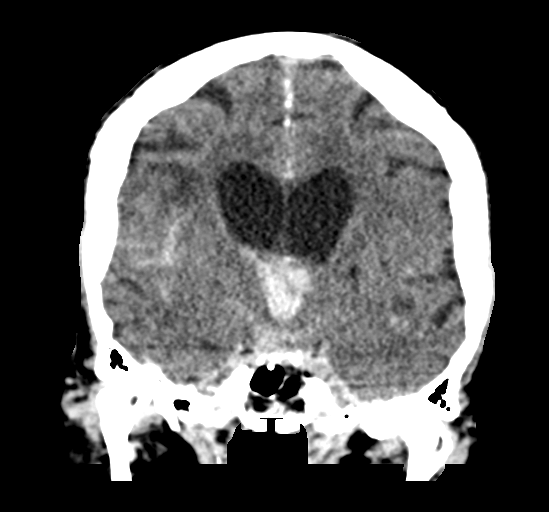

[Series 5: sag soft · sagittal · 0.32mm/px · 3 of 51 slices shown]
[im 17/51  brain]
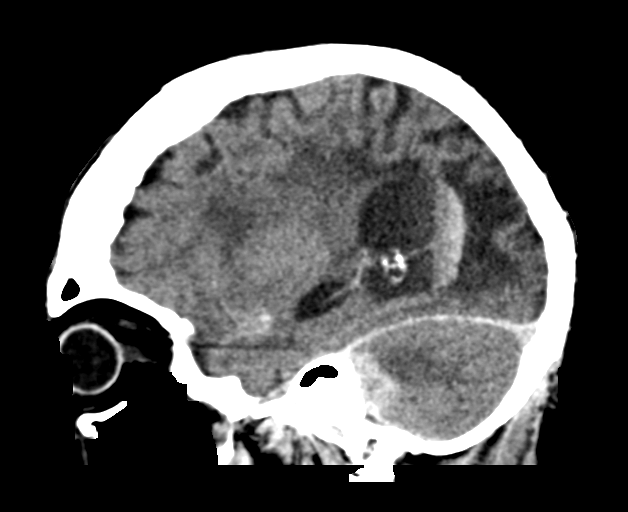
[im 26/51  brain]
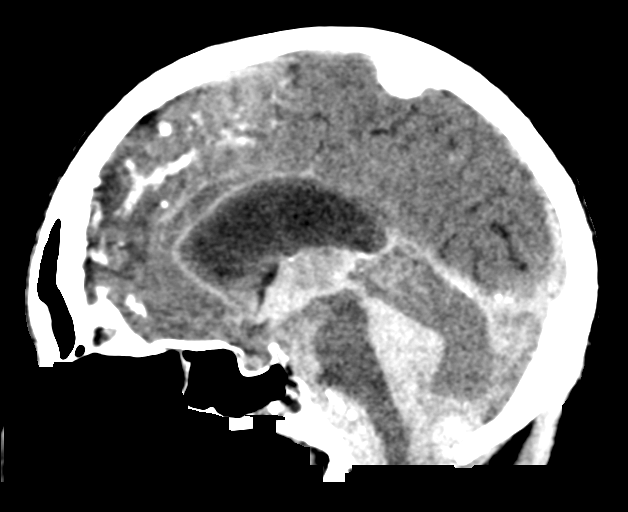
[im 34/51  brain]
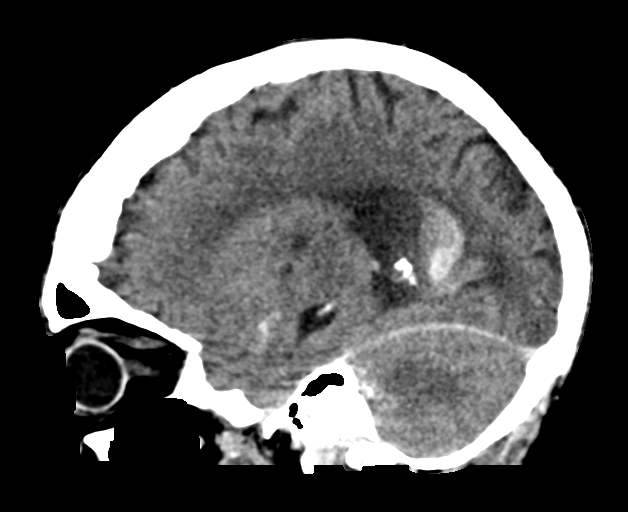

[15 of 47 positions shown; findings below may reference images not displayed]

FINDINGS: Brain: Subarachnoid hemorrhage is present involving the basal
cisterns with extension into cortical sulci. There is
intraventricular hemorrhage layering in the occipital horns, third
ventricle, and fourth ventricle. Disproportionate prominence of the
ventricles likely reflects hydrocephalus. Periventricular white
matter hypoattenuation could reflect a component of associated
interstitial edema.

Chronic right parietal infarct with some posterior frontal
involvement as well as occipital and posterior temporal involvement.
Probable chronic microvascular ischemic changes in the cerebral
white matter. Age-indeterminate, probable chronic small vessel
infarct of the left corona radiata and basal ganglia.

Vascular: There is atherosclerotic calcification at the skull base.

Skull: Calvarium is unremarkable.

Sinuses/Orbits: No acute finding.

Other: None.
IMPRESSION: Large volume subarachnoid hemorrhage at the skull base.
Intraventricular hemorrhage is present. Hydrocephalus is present.
CTA is recommended to evaluate for aneurysm.

These results were called by telephone at the time of interpretation
on 06/24/2021 at [DATE] to provider TARSHA PECH , who verbally
acknowledged these results.

## 2023-04-24 IMAGING — CT CT ABD-PELV W/ CM
2 of 5 series · 15 of 46 positions shown, 17 images · IV contrast (agent unspecified)
Comparison: 06/07/2018

CLINICAL DATA: Nausea and vomiting.  Foul smelling urine.

EXAM:
CT ABDOMEN AND PELVIS WITH CONTRAST
TECHNIQUE: Multidetector CT imaging of the abdomen and pelvis was performed
using the standard protocol following bolus administration of
intravenous contrast.

[Series 2: axial st · axial · 0.81mm/px · z∈[-405,-5]mm · 12 of 91 slices shown, 14 images]
[im 6/91  soft-tissue]
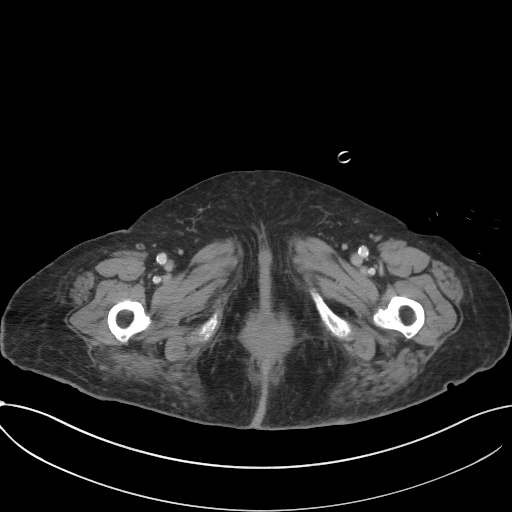
[im 6/91  bone]
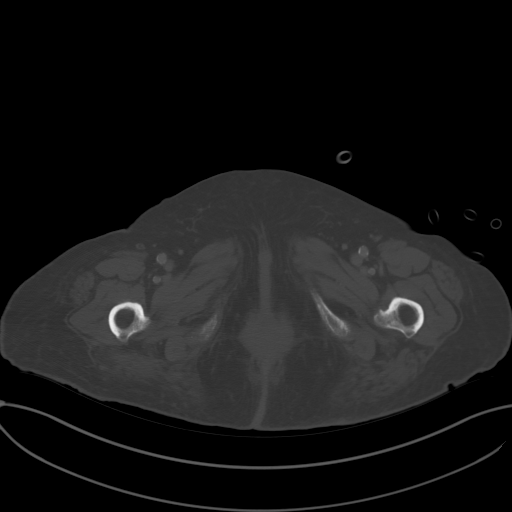
[im 16/91  soft-tissue]
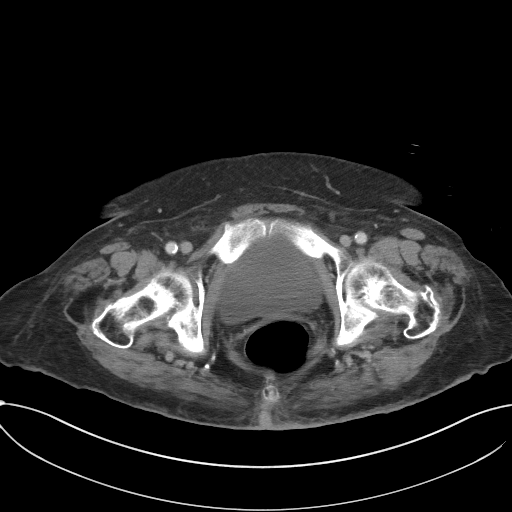
[im 21/91  soft-tissue]
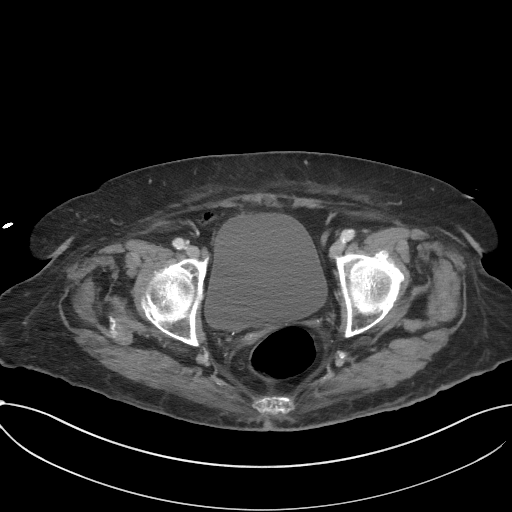
[im 26/91  soft-tissue]
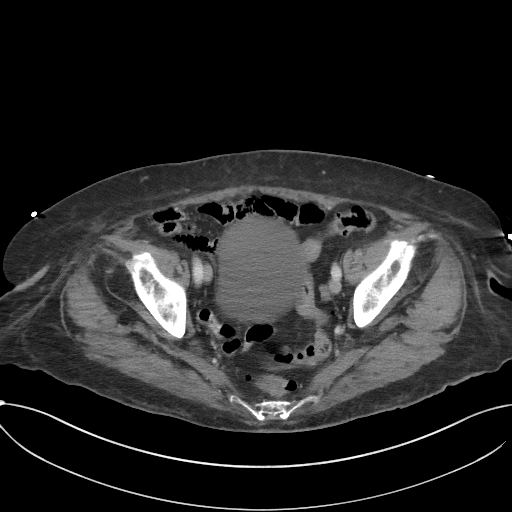
[im 36/91  soft-tissue]
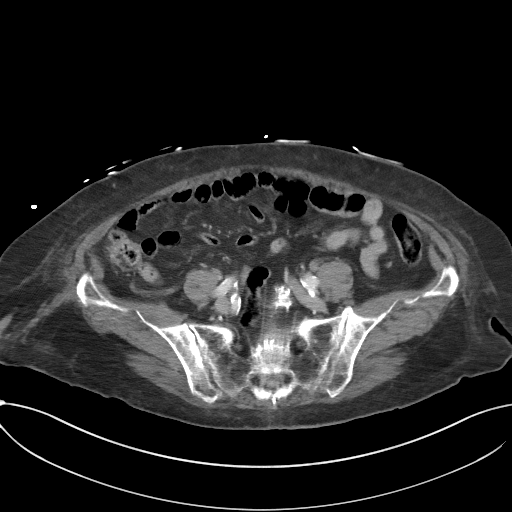
[im 41/91  soft-tissue]
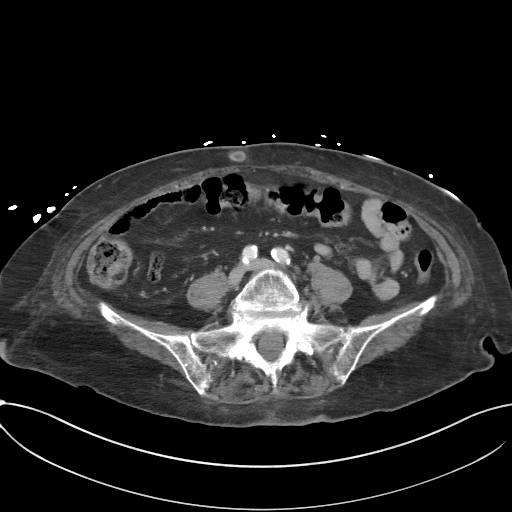
[im 51/91  soft-tissue]
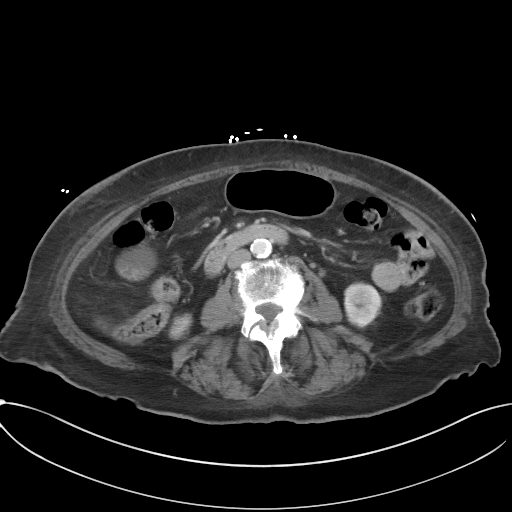
[im 56/91  soft-tissue]
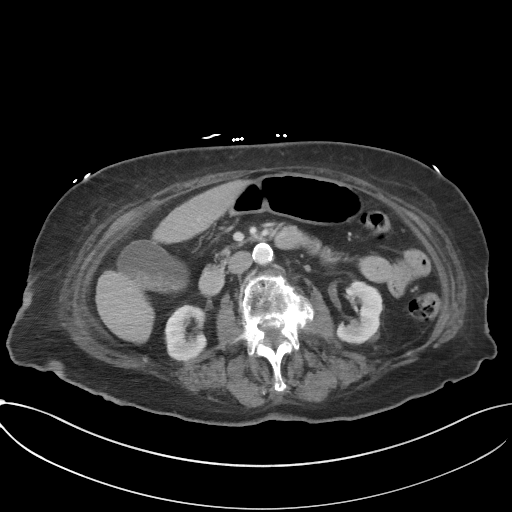
[im 66/91  soft-tissue]
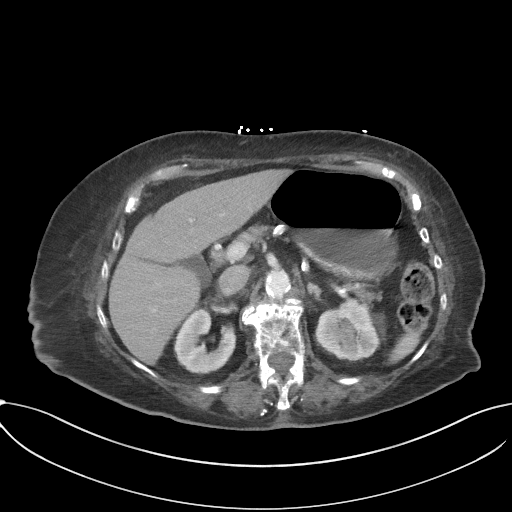
[im 66/91  bone]
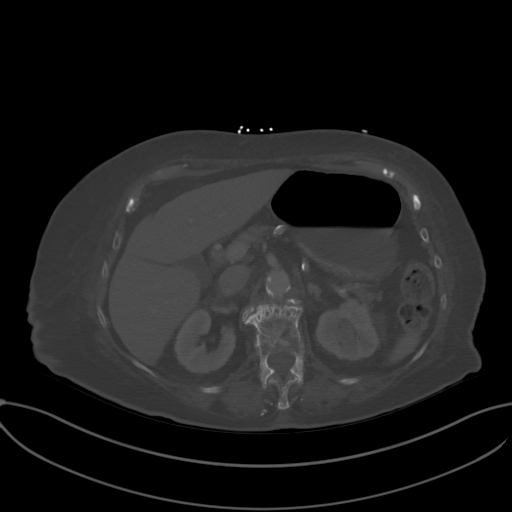
[im 71/91  soft-tissue]
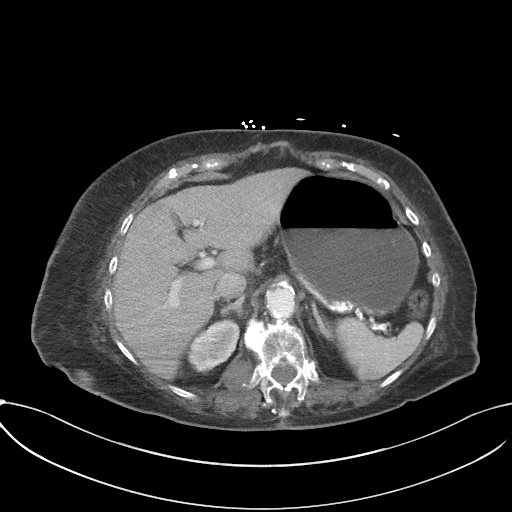
[im 76/91  soft-tissue]
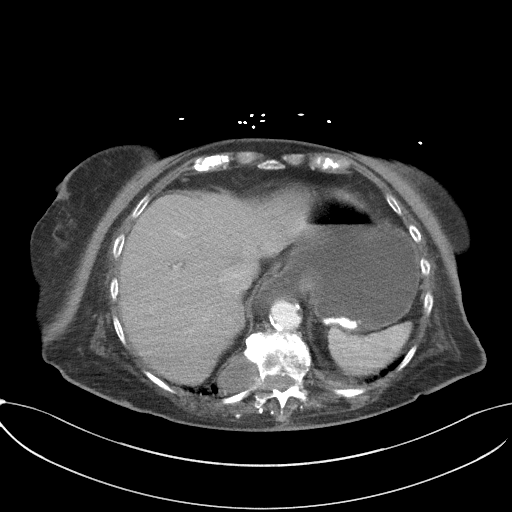
[im 86/91  soft-tissue]
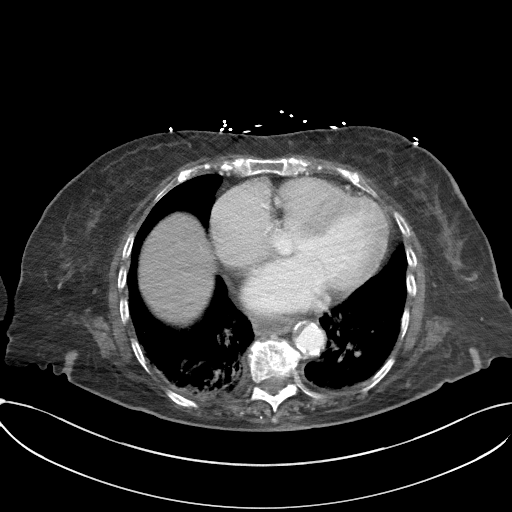

[Series 5: coronal st · coronal · 0.81mm/px · 3 of 83 slices shown]
[im 28/83  soft-tissue]
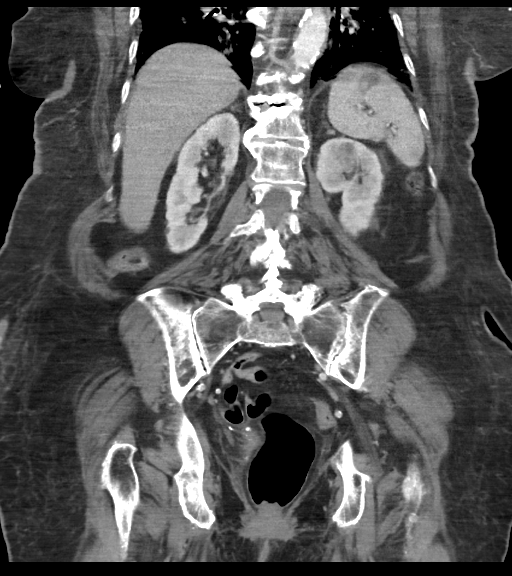
[im 37/83  soft-tissue]
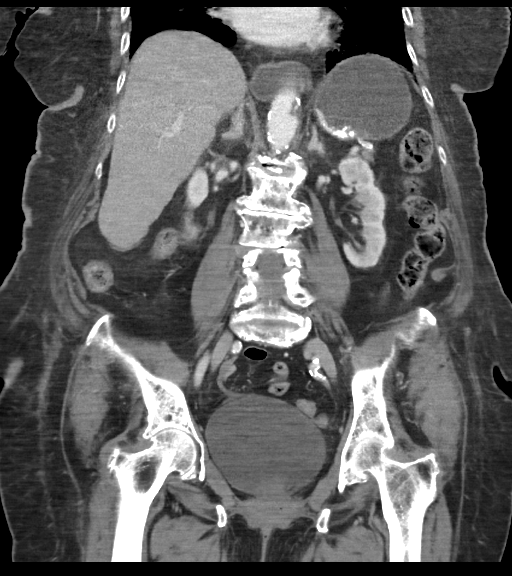
[im 46/83  soft-tissue]
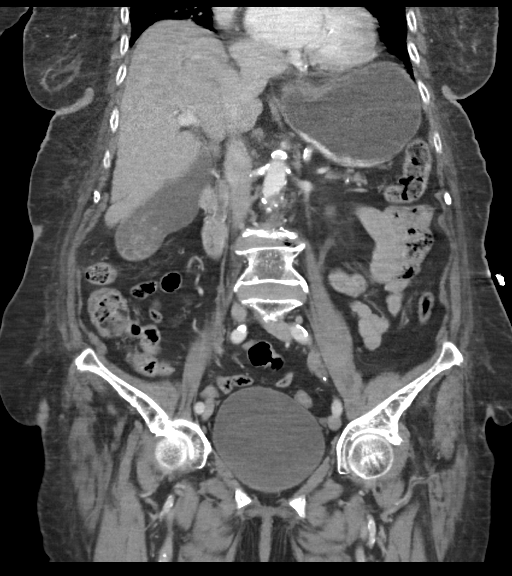

[15 of 46 positions shown; findings below may reference images not displayed]

RADIATION DOSE REDUCTION: This exam was performed according to the
departmental dose-optimization program which includes automated
exposure control, adjustment of the mA and/or kV according to
patient size and/or use of iterative reconstruction technique.

CONTRAST:  100mL OMNIPAQUE IOHEXOL 300 MG/ML  SOLN
FINDINGS: Lower chest: Enlarged heart with an interval increase in size.
Atheromatous calcifications, including the coronary arteries and
aorta. Moderate-sized, fluid-filled hiatal hernia with an interval
increase in size. The distal esophagus is also mildly dilated and
fluid-filled as is the proximal stomach.

Hepatobiliary: Multiple partially calcified gallstones in the
gallbladder, the largest measuring approximately 1.3 cm in maximum
diameter. No gallbladder wall thickening or pericholecystic fluid.
Unremarkable liver.

Pancreas: Moderate diffuse pancreatic atrophy.

Spleen: Normal in size without focal abnormality.

Adrenals/Urinary Tract: Normal appearing adrenal glands. Slight
increase in size of a simple appearing small upper pole left renal
cyst. Unremarkable right kidney, ureters and urinary bladder.

Stomach/Bowel: Moderate-sized, fluid-filled hiatal hernia with an
interval increase in size. The distal esophagus is also mildly
dilated and fluid-filled as is the proximal stomach. Small number of
sigmoid colon diverticula without evidence of diverticulitis.
Unremarkable small bowel and appendix.

Vascular/Lymphatic: Atheromatous arterial calcifications without
aneurysm. No enlarged lymph nodes.

Reproductive: Status post hysterectomy. Normal appearing ovaries. No
adnexal masses.

Other: Mild diffuse subcutaneous edema.

Musculoskeletal: Spinal and paraspinal mass measuring 5.0 x 2.5 cm
on image number [DATE], expanding the right T11-12 neural foramen with
chronic enlargement of the foramen. This previously measured
approximately 5.1 x 2.6 cm in corresponding dimensions.

Extensive lumbar and lower thoracic spine degenerative changes.
IMPRESSION: 1. No acute abnormality in the abdomen or pelvis.
2. Moderate-sized hiatal hernia with evidence of gastroesophageal
reflux.
3. Cholelithiasis without evidence of cholecystitis.
4. Sigmoid diverticulosis without evidence of diverticulitis.
5. Interval cardiomegaly and right lower lobe atelectasis.
6. Stable right T11-12 spinal and paraspinal mass measuring
approximately 5.0 x 2.5 cm with chronic widening of the right T11-12
neural foramen and erosion of the adjacent transverse process
anteriorly. This remains compatible with a neurogenic neoplasm.
7. Mild diffuse subcutaneous edema.
8.  Calcific coronary artery and aortic atherosclerosis.
